# Patient Record
Sex: Male | Born: 1985
Health system: Southern US, Community
[De-identification: ages and names within clinical notes are randomized; demographics above are authoritative.]

## PROBLEM LIST (undated history)

## (undated) DIAGNOSIS — I10 Essential (primary) hypertension: Secondary | ICD-10-CM

## (undated) DIAGNOSIS — T8859XA Other complications of anesthesia, initial encounter: Secondary | ICD-10-CM

## (undated) HISTORY — PX: KNEE SURGERY: SHX244

## (undated) HISTORY — PX: APPENDECTOMY: SHX54

---
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
## 2012-09-13 ENCOUNTER — Emergency Department (HOSPITAL_COMMUNITY): Payer: Medicaid - Out of State

## 2012-09-13 ENCOUNTER — Encounter (HOSPITAL_COMMUNITY): Payer: Self-pay | Admitting: Emergency Medicine

## 2012-09-13 ENCOUNTER — Emergency Department (HOSPITAL_COMMUNITY)
Admission: EM | Admit: 2012-09-13 | Discharge: 2012-09-13 | Disposition: A | Discharge status: Home / Self Care | Payer: Medicaid - Out of State | Attending: Emergency Medicine | Admitting: Emergency Medicine

## 2012-09-13 DIAGNOSIS — I10 Essential (primary) hypertension: Secondary | ICD-10-CM | POA: Insufficient documentation

## 2012-09-13 DIAGNOSIS — R079 Chest pain, unspecified: Secondary | ICD-10-CM | POA: Insufficient documentation

## 2012-09-13 HISTORY — DX: Essential (primary) hypertension: I10

## 2012-09-13 LAB — BASIC METABOLIC PANEL
BUN: 11 mg/dL (ref 6–23)
CO2: 27 mEq/L (ref 19–32)
Chloride: 104 mEq/L (ref 96–112)
Glucose, Bld: 87 mg/dL (ref 70–99)
Potassium: 3.9 mEq/L (ref 3.5–5.1)

## 2012-09-13 LAB — CBC WITH DIFFERENTIAL/PLATELET
Hemoglobin: 15.9 g/dL (ref 13.0–17.0)
Lymphocytes Relative: 51 % — ABNORMAL HIGH (ref 12–46)
Lymphs Abs: 1.6 10*3/uL (ref 0.7–4.0)
MCH: 27 pg (ref 26.0–34.0)
Monocytes Relative: 9 % (ref 3–12)
Neutro Abs: 1.1 10*3/uL — ABNORMAL LOW (ref 1.7–7.7)
Neutrophils Relative %: 37 % — ABNORMAL LOW (ref 43–77)
RBC: 5.89 MIL/uL — ABNORMAL HIGH (ref 4.22–5.81)

## 2012-09-13 LAB — D-DIMER, QUANTITATIVE

## 2012-09-13 LAB — POCT I-STAT TROPONIN I: Troponin i, poc: 0 ng/mL (ref 0.00–0.08)

## 2012-09-13 LAB — PROTIME-INR

## 2012-09-13 MED ORDER — IOHEXOL 350 MG/ML SOLN
100.0000 mL | Freq: Once | INTRAVENOUS | Status: AC | PRN
  Administered 2012-09-13: 100 mL via INTRAVENOUS

## 2012-09-13 NOTE — ED Notes (Signed)
Pt reports chest pain on and off for past 3 months.  Reports chest pain started again last night.  Denies sob, dizziness or nausea.
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
## 2012-09-17 NOTE — ED Provider Notes (Signed)
Medical screening examination/treatment/procedure(s) were performed by non-physician practitioner and as supervising physician I was immediately available for consultation/collaboration.   Mayling Aber M Keric Zehren, DO 09/17/12 1402 

## 2013-06-26 ENCOUNTER — Encounter (HOSPITAL_COMMUNITY): Payer: Self-pay | Admitting: Emergency Medicine

## 2013-06-26 ENCOUNTER — Emergency Department (HOSPITAL_COMMUNITY)
Admission: EM | Admit: 2013-06-26 | Discharge: 2013-06-26 | Discharge status: Against Medical Advice | Payer: Medicaid - Out of State | Attending: Emergency Medicine | Admitting: Emergency Medicine

## 2013-06-26 DIAGNOSIS — I10 Essential (primary) hypertension: Secondary | ICD-10-CM | POA: Insufficient documentation

## 2013-06-26 DIAGNOSIS — F1010 Alcohol abuse, uncomplicated: Secondary | ICD-10-CM | POA: Insufficient documentation

## 2013-06-26 DIAGNOSIS — F10929 Alcohol use, unspecified with intoxication, unspecified: Secondary | ICD-10-CM

## 2013-06-26 DIAGNOSIS — IMO0002 Reserved for concepts with insufficient information to code with codable children: Secondary | ICD-10-CM | POA: Insufficient documentation
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
## 2013-06-26 NOTE — ED Notes (Signed)
Waiting to assume care of pt, responsible family members at bedside.  PA stated that pt could return home as long as he had a safe ride home.  Pt left with family before e-signature or vs could be obtained.  PA aware.
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
## 2013-06-26 NOTE — ED Notes (Signed)
Bed: [id number] Expected date: 06/26/13 Expected time: 6:02 PM Means of arrival: Ambulance Comments: EMS

## 2013-06-26 NOTE — ED Provider Notes (Signed)
Medical screening examination/treatment/procedure(s) were performed by non-physician practitioner and as supervising physician I was immediately available for consultation/collaboration.   EKG Interpretation None        Chicquita Mendel, MD 06/26/13 1959 

## 2014-12-14 ENCOUNTER — Other Ambulatory Visit: Payer: Self-pay | Admitting: Occupational Medicine

## 2014-12-14 ENCOUNTER — Ambulatory Visit: Payer: Self-pay

## 2014-12-14 DIAGNOSIS — M79644 Pain in right finger(s): Secondary | ICD-10-CM

## 2014-12-27 ENCOUNTER — Emergency Department (HOSPITAL_COMMUNITY)
Admission: EM | Admit: 2014-12-27 | Discharge: 2014-12-27 | Disposition: A | Discharge status: Home / Self Care | Payer: BLUE CROSS/BLUE SHIELD | Attending: Emergency Medicine | Admitting: Emergency Medicine

## 2014-12-27 ENCOUNTER — Emergency Department (HOSPITAL_COMMUNITY): Payer: BLUE CROSS/BLUE SHIELD

## 2014-12-27 ENCOUNTER — Encounter (HOSPITAL_COMMUNITY): Payer: Self-pay | Admitting: Neurology

## 2014-12-27 DIAGNOSIS — Z79899 Other long term (current) drug therapy: Secondary | ICD-10-CM | POA: Insufficient documentation

## 2014-12-27 DIAGNOSIS — I10 Essential (primary) hypertension: Secondary | ICD-10-CM | POA: Diagnosis not present

## 2014-12-27 DIAGNOSIS — R079 Chest pain, unspecified: Secondary | ICD-10-CM | POA: Diagnosis present

## 2014-12-27 LAB — BASIC METABOLIC PANEL
Anion gap: 10 (ref 5–15)
BUN: 13 mg/dL (ref 6–20)
CALCIUM: 9.9 mg/dL (ref 8.9–10.3)
CO2: 26 mmol/L (ref 22–32)
CREATININE: 1.22 mg/dL (ref 0.61–1.24)
Chloride: 105 mmol/L (ref 101–111)
GLUCOSE: 101 mg/dL — AB (ref 65–99)
Potassium: 4 mmol/L (ref 3.5–5.1)
SODIUM: 141 mmol/L (ref 135–145)

## 2014-12-27 LAB — CBC
HCT: 47.3 % (ref 39.0–52.0)
Hemoglobin: 15.6 g/dL (ref 13.0–17.0)
MCH: 28 pg (ref 26.0–34.0)
MCHC: 33 g/dL (ref 30.0–36.0)
MCV: 84.9 fL (ref 78.0–100.0)
PLATELETS: 216 10*3/uL (ref 150–400)
RBC: 5.57 MIL/uL (ref 4.22–5.81)
RDW: 12.7 % (ref 11.5–15.5)
WBC: 3.9 10*3/uL — ABNORMAL LOW (ref 4.0–10.5)

## 2014-12-27 LAB — I-STAT TROPONIN, ED: Troponin i, poc: 0.01 ng/mL (ref 0.00–0.08)

## 2014-12-27 NOTE — Discharge Instructions (Signed)
Hypertension Hypertension, commonly called high blood pressure, is when the force of blood pumping through your arteries is too strong. Your arteries are the blood vessels that carry blood from your heart throughout your body. A blood pressure reading consists of a higher number over a lower number, such as 110/72. The higher number (systolic) is the pressure inside your arteries when your heart pumps. The lower number (diastolic) is the pressure inside your arteries when your heart relaxes. Ideally you want your blood pressure below 120/80. Hypertension forces your heart to work harder to pump blood. Your arteries may become narrow or stiff. Having untreated or uncontrolled hypertension can cause heart attack, stroke, kidney disease, and other problems. RISK FACTORS Some risk factors for high blood pressure are controllable. Others are not.  Risk factors you cannot control include:   Race. You may be at higher risk if you are African American.  Age. Risk increases with age.  Gender. Men are at higher risk than women before age 45 years. After age 65, women are at higher risk than men. Risk factors you can control include:  Not getting enough exercise or physical activity.  Being overweight.  Getting too much fat, sugar, calories, or salt in your diet.  Drinking too much alcohol. SIGNS AND SYMPTOMS Hypertension does not usually cause signs or symptoms. Extremely high blood pressure (hypertensive crisis) may cause headache, anxiety, shortness of breath, and nosebleed. DIAGNOSIS To check if you have hypertension, your health care provider will measure your blood pressure while you are seated, with your arm held at the level of your heart. It should be measured at least twice using the same arm. Certain conditions can cause a difference in blood pressure between your right and left arms. A blood pressure reading that is higher than normal on one occasion does not mean that you need treatment. If  it is not clear whether you have high blood pressure, you may be asked to return on a different day to have your blood pressure checked again. Or, you may be asked to monitor your blood pressure at home for 1 or more weeks. TREATMENT Treating high blood pressure includes making lifestyle changes and possibly taking medicine. Living a healthy lifestyle can help lower high blood pressure. You may need to change some of your habits. Lifestyle changes may include:  Following the DASH diet. This diet is high in fruits, vegetables, and whole grains. It is low in salt, red meat, and added sugars.  Keep your sodium intake below 2,300 mg per day.  Getting at least 30-45 minutes of aerobic exercise at least 4 times per week.  Losing weight if necessary.  Not smoking.  Limiting alcoholic beverages.  Learning ways to reduce stress. Your health care provider may prescribe medicine if lifestyle changes are not enough to get your blood pressure under control, and if one of the following is true:  You are 18-59 years of age and your systolic blood pressure is above 140.  You are 60 years of age or older, and your systolic blood pressure is above 150.  Your diastolic blood pressure is above 90.  You have diabetes, and your systolic blood pressure is over 140 or your diastolic blood pressure is over 90.  You have kidney disease and your blood pressure is above 140/90.  You have heart disease and your blood pressure is above 140/90. Your personal target blood pressure may vary depending on your medical conditions, your age, and other factors. HOME CARE INSTRUCTIONS    Have your blood pressure rechecked as directed by your health care provider.   Take medicines only as directed by your health care provider. Follow the directions carefully. Blood pressure medicines must be taken as prescribed. The medicine does not work as well when you skip doses. Skipping doses also puts you at risk for  problems.  Do not smoke.   Monitor your blood pressure at home as directed by your health care provider. SEEK MEDICAL CARE IF:   You think you are having a reaction to medicines taken.  You have recurrent headaches or feel dizzy.  You have swelling in your ankles.  You have trouble with your vision. SEEK IMMEDIATE MEDICAL CARE IF:  You develop a severe headache or confusion.  You have unusual weakness, numbness, or feel faint.  You have severe chest or abdominal pain.  You vomit repeatedly.  IYou have trouble breathing. MAKE SURE YOU:   Understand these instructions.  Will watch your condition.  Will get help right away if you are not doing well or get worse.   This information is not intended to replace advice given to you by your health care provider. Make sure you discuss any questions you have with your health care provider.   Document Released: 02/24/2005 Document Revised: 07/11/2014 Document Reviewed: 12/17/2012 Elsevier Interactive Patient Education 2016 [employer].  Emergency Department Resource Guide 1) Find a Doctor and Pay Out of Pocket Although you won't have to find out who is covered by your insurance plan, it is a good idea to ask around and get recommendations. You will then need to call the office and see if the doctor you have chosen will accept you as a new patient and what types of options they offer for patients who are self-pay. Some doctors offer discounts or will set up payment plans for their patients who do not have insurance, but you will need to ask so you aren't surprised when you get to your appointment.  2) Contact Your Local Health Department Not all health departments have doctors that can see patients for sick visits, but many do, so it is worth a call to see if yours does. If you don't know where your local health department is, you can check in your phone book. The CDC also has a tool to help you locate your state's health  department, and many state websites also have listings of all of their local health departments.  3) Find a Walk-in Clinic If your illness is not likely to be very severe or complicated, you may want to try a walk in clinic. These are popping up all over the country in pharmacies, drugstores, and shopping centers. They're usually staffed by nurse practitioners or physician assistants that have been trained to treat common illnesses and complaints. They're usually fairly quick and inexpensive. However, if you have serious medical issues or chronic medical problems, these are probably not your best option.  No Primary Care Doctor: - Call Health Connect at  [phone] - they can help you locate a primary care doctor that  accepts your insurance, provides certain services, etc. - Physician Referral Service- [phone]  Chronic Pain Problems: Organization         Address  Phone   Notes  [facility]  [phone] Patients need to be referred by their primary care doctor.   Medication Assistance: Organization         Address  Phone   Notes  [facility] Medication Assistance  Program [address] [place], [place] [address] [phone]) [phone] --Must be a resident of [place] [facility] -- Must have NO insurance coverage whatsoever (no Medicaid/ Medicare, etc.) -- The pt. MUST have a primary care doctor that directs their care regularly and follows them in the community   MedAssist  [phone]) [phone]   [employer]  [phone]) [phone]    Agencies that provide inexpensive medical care: Organization         Address  Phone   Notes  [facility]  [phone]) [phone]   [facility]    [phone]   [facility] [address] [place], [place] [address] [phone]) [phone]   [facility] [address], [place] [phone]   [facility]anned [facility]    [phone]   [facility]    [phone]) [phone]    [facility]  [address], [place] Phone:  [phone]) [phone], Fax:  [phone] Hours of Operation:  9 am - 6 pm, M-F.  Also accepts Medicaid/Medicare and self-pay.  [facility]  [address], [place] Phone: [phone]) [phone], Fax: [phone]. Hours of Operation:  8:30 am - 5:30 pm, M-F.  Also accepts Medicaid and self-pay.  [facility] [place] [address], [place] Phone: [phone]) [phone]   [facility] [address], [place], [place] [phone], Ext. [phone] Mondays & Thursdays: 7-9 AM.  First 15 patients are seen on a first come, first serve basis.    Medicaid-accepting [facility] Providers:  Organization         Address  Phone   Notes  [facility] [address], [place] [phone] Also accepts self-pay patients.  [facility] [address], [place]  [phone]   [facility] [address], [place] [phone]) [phone]   [facility] [address] [place] [address], [place] [phone]) [phone]   Renaye RakersVeita Bland [address], [place]   [phone]) [phone] Only accepts [place] Access [place] patients after they have their name applied to their card.   Self-Pay (no insurance) in [facility]:  Organization         Address  Phone   Notes  Sickle Cell Patients, [facility] [address], [place] [phone]   [facility] [address], [place] [phone]) [phone]   [facility] [place]  [address], [place] [phone]) [phone]   [facility]/Dr. Osei-Bonsu  [address] [place] [address], [place] or [phone], [place] [phone] Phone number for both [place] and [place] locations is the same.  [facility] [address], [place] [phone]) [phone]    [facility] [address] [place] [address], [place] or [address] [phone]   [facility] [address], [place] [phone]) [phone], phone; [phone]) [phone], fax Sees patients 1st and 3rd Saturday of every month.  Must not qualify for public or private insurance (i.e. Medicaid, Medicare, [place] Health Choice, Veterans' Benefits)  Household income should be no more than 200% of the poverty level The clinic cannot treat you if you are pregnant or think you are pregnant  Sexually transmitted diseases are not treated at the  clinic.    Dental Care: Organization         Address  Phone  Notes  [facility] [address] [place], [place] [phone] Accepts children up to age 34 who are enrolled in [place] or [place] Health Choice; pregnant women with a Medicaid card; and children who have applied for Medicaid or [place] Health Choice, but were declined, whose parents can pay a reduced fee at time of service.  [facility]  [address], [place] [phone] Accepts children up to age 94 who are enrolled in [place] or [place] Health Choice; pregnant women with a Medicaid card; and children who have applied for Medicaid or [place] Health Choice, but were declined, whose parents can pay a reduced fee at time of service.  [facility]  [address] [place], [place] [phone] Patients are seen by appointment only. Walk-ins are not accepted. [facility] will see patients 66 years of age and older. Monday - Tuesday (8am-5pm) Most Wednesdays (8:30-5pm) $30 per visit, cash only  [facility] Adult Dental Access PROGRAM  [address], [place] [facility] [phone] Patients are seen by appointment only. Walk-ins are not accepted. [facility] will see patients 41 years of age and older. One Wednesday Evening (Monthly: Volunteer Based).   $30 per visit, cash only  [employer]  [phone] for adults; Children under age 79, call Graduate Pediatric Dentistry at [phone]. Children aged 23-14, please call [phone] to request a pediatric application.  Dental services are provided in all areas of dental care including fillings, crowns and bridges, complete and partial dentures, implants, gum treatment, root canals, and extractions. Preventive care is also provided. Treatment is provided to both adults and children. Patients are selected via a lottery and there is often a waiting list.   [facility] [address] [place]  [phone] [email or web address]   [facility] [address] [place], [place] [phone], Ext. [phone] Second and Fourth Thursday of each month, opens at 6:30 AM; Clinic ends at 9 AM.  Patients are seen on a first-come first-served basis, and a limited number are seen during each clinic.   [facility]  [address] [place] [place], [place] [phone]   Eligibility Requirements You must have lived in [place], [place], or [place] counties for at least the last three months.   You cannot be eligible for state or federal sponsored [place], including [employer], [place], or [employer].   You generally cannot be eligible for healthcare insurance through your employer.    How to apply: Eligibility screenings are held every Tuesday and Wednesday afternoon from 1:00 pm until 4:00 pm. You do not need an appointment for the interview!  [facility] [address] [place], [place] [phone]   [facility] Health Department  [phone]   [facility] Health Department  [phone]   [facility] Health Department  [phone]    Behavioral Health Resources in the Community: Intensive Outpatient Programs Organization         Address  Phone  Notes  [facility] [address] [place], [place] [phone]   [facility] [address] [place], [place] [phone]   [facility] [address] [place], [place]  [phone]   [facility]  [address]  [place], [place] [phone] or [phone]   Substance Abuse Resources Organization         Address  Phone  Notes  [facility]  [phone]   [facility]  [phone]   [facility] [place]  [phone]   Floydene Flock  [phone]   Residential & Outpatient Substance Abuse Program  [phone]   Psychological Services Organization         Address  Phone  Notes  [facility]  [phone]   [facility] Services  [phone]   [facility] [address], [place] [phone] or 8451439728    Mobile Crisis Teams Organization         Address  Phone  Notes  [facility], Mobile Crisis Care Unit  [phone]   [facility]  [address] [place], [place] [phone]   Doristine Locks [address] [place] [phone]    Self-Help/Support Groups Organization         Address  Phone             Notes  [facility] [place] - variety of support groups  [phone]- I7437963 Call for more information  Narcotics Anonymous (NA), [facility] [address], [employer] [place]  2 meetings at this location   Statistician         Address  Phone  Notes  [facility] [address],    [place]  [phone]   [facility]  [address], [place] [address], [place], [place] [phone]   [facility] [address] [place], [place] [phone] Admissions: 8am-3pm M-F  [facility] [address]    [place], [place] [phone]   [facility] [address] [place], [place], [place] [phone]   [facility] [address],  [place], [place] [phone]   [facility] - Intensive Outpatient [address], [place], [place] [phone]   [facility] ([facility] [address] [place]  [place], [place] [phone] or [phone]   [facility] ([facility]) [address], [place], [place] [phone] Accepts Medicaid  [facility] [address],  [place] [phone] Substance Abuse/Addiction Treatment   [facility] Organization         Address  Phone  Notes  [facility]  [phone]   Angie Fava, PhD [address] Kn[address] [place], [place]   [phone] or 9307781298   [facility] Behavioral   [address] [place], [place] [phone]   [facility] [address], [place], [place] [phone] Insurance/Medicaid/sponsorship through [facility] and Families [address]                                    [place], [place] [phone] Therapy/tele-psych/case  [facility] [address], [place] [phone]    Dr. Lolly Mustache  [phone]   [facility]  [employer] [facility] Dept. 1) [address] [place] 2) [address], [place] 3)  [address], [place] [phone]  [phone]   [facility] Child Abuse Hotline 310-190-0584 or [phone] (After Hours)  It is very important that you take your medication every day at about the same time.  I would also like you to have your blood pressure checked 2-3 times a  Week at different time during the day.  Make an appointment with a PCP to keep establish care.   .res

## 2014-12-27 NOTE — ED Notes (Signed)
Pt reports sharp intermittent cp to left side of chest for several days. Denies n/v/d. Skin warm and dry.
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
## 2018-07-27 DIAGNOSIS — R0982 Postnasal drip: Secondary | ICD-10-CM | POA: Diagnosis not present

## 2018-07-27 DIAGNOSIS — R05 Cough: Secondary | ICD-10-CM | POA: Diagnosis not present

## 2018-07-27 DIAGNOSIS — R509 Fever, unspecified: Secondary | ICD-10-CM | POA: Diagnosis not present

## 2018-07-27 DIAGNOSIS — Z20828 Contact with and (suspected) exposure to other viral communicable diseases: Secondary | ICD-10-CM | POA: Diagnosis not present

## 2018-07-27 DIAGNOSIS — I10 Essential (primary) hypertension: Secondary | ICD-10-CM | POA: Diagnosis not present

## 2018-07-27 DIAGNOSIS — J029 Acute pharyngitis, unspecified: Secondary | ICD-10-CM | POA: Diagnosis not present

## 2018-07-27 DIAGNOSIS — R52 Pain, unspecified: Secondary | ICD-10-CM | POA: Diagnosis not present

## 2018-07-27 DIAGNOSIS — R0981 Nasal congestion: Secondary | ICD-10-CM | POA: Diagnosis not present

## 2018-09-27 ENCOUNTER — Other Ambulatory Visit: Payer: Self-pay

## 2018-09-27 ENCOUNTER — Emergency Department (HOSPITAL_BASED_OUTPATIENT_CLINIC_OR_DEPARTMENT_OTHER): Payer: BC Managed Care – PPO

## 2018-09-27 ENCOUNTER — Encounter (HOSPITAL_BASED_OUTPATIENT_CLINIC_OR_DEPARTMENT_OTHER): Payer: Self-pay | Admitting: *Deleted

## 2018-09-27 ENCOUNTER — Emergency Department (HOSPITAL_BASED_OUTPATIENT_CLINIC_OR_DEPARTMENT_OTHER)
Admission: EM | Admit: 2018-09-27 | Discharge: 2018-09-27 | Disposition: A | Discharge status: Home / Self Care | Payer: BC Managed Care – PPO | Attending: Emergency Medicine | Admitting: Emergency Medicine

## 2018-09-27 DIAGNOSIS — F1490 Cocaine use, unspecified, uncomplicated: Secondary | ICD-10-CM | POA: Diagnosis not present

## 2018-09-27 DIAGNOSIS — R0789 Other chest pain: Secondary | ICD-10-CM | POA: Diagnosis not present

## 2018-09-27 DIAGNOSIS — F1410 Cocaine abuse, uncomplicated: Secondary | ICD-10-CM | POA: Diagnosis not present

## 2018-09-27 DIAGNOSIS — Z79899 Other long term (current) drug therapy: Secondary | ICD-10-CM | POA: Diagnosis not present

## 2018-09-27 DIAGNOSIS — R079 Chest pain, unspecified: Secondary | ICD-10-CM | POA: Diagnosis not present

## 2018-09-27 DIAGNOSIS — I10 Essential (primary) hypertension: Secondary | ICD-10-CM | POA: Diagnosis not present

## 2018-09-27 LAB — BASIC METABOLIC PANEL
Anion gap: 12 (ref 5–15)
BUN: 13 mg/dL (ref 6–20)
CO2: 27 mmol/L (ref 22–32)
Calcium: 9.8 mg/dL (ref 8.9–10.3)
Chloride: 99 mmol/L (ref 98–111)
Creatinine, Ser: 1.17 mg/dL (ref 0.61–1.24)
GFR calc Af Amer: >60 mL/min (ref >60)
GFR calc non Af Amer: >60 mL/min (ref >60)
Glucose, Bld: 92 mg/dL (ref 70–99)
Potassium: 3.6 mmol/L (ref 3.5–5.1)
Sodium: 138 mmol/L (ref 135–145)

## 2018-09-27 LAB — CBC WITH DIFFERENTIAL/PLATELET
Abs Immature Granulocytes: 0.03 10*3/uL (ref 0.00–0.07)
Basophils Absolute: 0 10*3/uL (ref 0.0–0.1)
Basophils Relative: 1 %
Eosinophils Absolute: 0.1 10*3/uL (ref 0.0–0.5)
Eosinophils Relative: 3 %
HCT: 51.2 % (ref 39.0–52.0)
Hemoglobin: 15.9 g/dL (ref 13.0–17.0)
Immature Granulocytes: 1 %
Lymphocytes Relative: 34 %
Lymphs Abs: 1.4 10*3/uL (ref 0.7–4.0)
MCH: 26.9 pg (ref 26.0–34.0)
MCHC: 31.1 g/dL (ref 30.0–36.0)
MCV: 86.8 fL (ref 80.0–100.0)
Monocytes Absolute: 0.5 10*3/uL (ref 0.1–1.0)
Monocytes Relative: 11 %
Neutro Abs: 2.1 10*3/uL (ref 1.7–7.7)
Neutrophils Relative %: 50 %
Platelets: 254 10*3/uL (ref 150–400)
RBC: 5.9 MIL/uL — ABNORMAL HIGH (ref 4.22–5.81)
RDW: 13.6 % (ref 11.5–15.5)
WBC: 4.2 10*3/uL (ref 4.0–10.5)
nRBC: 0 % (ref 0.0–0.2)

## 2018-09-27 LAB — TROPONIN I (HIGH SENSITIVITY): Troponin I (High Sensitivity): 4 ng/L (ref <18)

## 2018-09-27 NOTE — ED Notes (Signed)
ED Provider at bedside. 

## 2018-09-27 NOTE — Discharge Instructions (Signed)
Given that you have had pain for more than two hours your troponin (heart enzyme) falls in the normal range.   Please make sure that you are taking your blood pressure medicine.  As we discussed I would recommend that you stop using cocaine as this can worsen high blood pressure and any heart disease.  I have given you the information for cardiology.  If you stop all cocaine use for multiple weeks and still have concerns you may follow-up with them.  I have also given you the information for [facility].  If your symptoms worsen, change, or become concerning to you please seek additional medical care and evaluation.

## 2018-09-27 NOTE — ED Triage Notes (Signed)
Pt c/o chest pain x 2 days, Seen at [facility] today DX hypertension and sent here for eval

## 2018-09-27 NOTE — ED Provider Notes (Signed)
[facility] EMERGENCY DEPARTMENT Provider Note   CSN: [id number] Arrival date & time: 09/27/18  1748    History   Chief Complaint Chief Complaint  Patient presents with  . Chest Pain    HPI Jason [name] is a 33 y.o. male with a past medical history of hypertension, who presents today for evaluation of chest pain.  Reports that over the past many years he intermittently gets pain in the left side of his chest.  He states that approximately 1 week ago he ran out of his lisinopril-HCTZ.  He initially went to urgent care who gave him a 30-day refill and sent him here for evaluation.  He reports that this episode of chest pain started this morning when he woke up.  He denies shortness of breath.  The pain is not radiated or moved.  He denies diaphoresis, nausea, fever, or cough.  He states that usually when he gets this pain it lasts throughout the morning and is gone by noon however it has lasted the entire day before.  He reports that he does smoke occasional cigarettes when he drinks on the weekends.  He also endorses occasional cocaine use, stating last time he used was Thursday night.  He denies any known sick contacts.  His pain is made worse with moving his left arm as he feels that pulling across his chest worsening in his pain.  No interventions tried prior to arrival.  He denies any trauma.  No leg swelling.  No recent surgery/immobilizations.  No hemoptysis, history of PE/DVT.    HPI  Past Medical History:  Diagnosis Date  . Hypertension     There are no active problems to display for this patient.   Past Surgical History:  Procedure Laterality Date  . KNEE SURGERY          Home Medications    Prior to Admission medications   Medication Sig Start Date End Date Taking? Authorizing Provider  lisinopril-hydrochlorothiazide (ZESTORETIC) 20-25 MG tablet Take 1 tablet by mouth daily.   Yes [provider]    Family History History reviewed. No  pertinent family history.  Social History Social History   Tobacco Use  . Smoking status: Never Smoker  . Smokeless tobacco: Never Used  Substance Use Topics  . Alcohol use: Yes  . Drug use: Not on file     Allergies   Patient has no known allergies.   Review of Systems Review of Systems  Constitutional: Negative for chills, diaphoresis, fatigue and fever.  Eyes: Negative for visual disturbance.  Respiratory: Negative for cough, chest tightness and shortness of breath.   Cardiovascular: Positive for chest pain. Negative for palpitations and leg swelling.  Gastrointestinal: Negative for nausea.  Neurological: Negative for weakness and headaches.  All other systems reviewed and are negative.    Physical Exam Updated Vital Signs BP (!) 147/98 (BP Location: Right Arm)   Pulse 61   Temp 98.5 F (36.9 C) (Oral)   Resp 18   Ht 6\' 1"  (1.854 m)   Wt 104.3 kg   SpO2 100%   BMI 30.34 kg/m   Physical Exam Vitals signs and nursing note reviewed.  Constitutional:      Appearance: He is well-developed. He is not toxic-appearing.  HENT:     Head: Normocephalic and atraumatic.  Eyes:     Conjunctiva/sclera: Conjunctivae normal.  Neck:     Musculoskeletal: Normal range of motion and neck supple.     Vascular: No JVD.  Trachea: No tracheal deviation.  Cardiovascular:     Rate and Rhythm: Normal rate and regular rhythm.     Pulses:          Radial pulses are 2+ on the right side and 2+ on the left side.       Dorsalis pedis pulses are 2+ on the right side and 2+ on the left side.       Posterior tibial pulses are 2+ on the right side and 2+ on the left side.     Heart sounds: Normal heart sounds. No murmur.  Pulmonary:     Effort: Pulmonary effort is normal. No respiratory distress.     Breath sounds: Normal breath sounds.  Chest:     Chest wall: No tenderness.     Comments: When abducting left arm able to recreate and exacerbate his reported pain in his chest.   Unable to recreate pain with direct pressure/palpation over chest.  Abdominal:     Palpations: Abdomen is soft.     Tenderness: There is no abdominal tenderness.  Musculoskeletal:     Right lower leg: He exhibits no tenderness. No edema.     Left lower leg: He exhibits no tenderness. No edema.  Skin:    General: Skin is warm and dry.  Neurological:     Mental Status: He is alert.      ED Treatments / Results  Labs (all labs ordered are listed, but only abnormal results are displayed) Labs Reviewed  CBC WITH DIFFERENTIAL/PLATELET - Abnormal; Notable for the following components:      Result Value   RBC 5.90 (*)    All other components within normal limits  BASIC METABOLIC PANEL  TROPONIN I (HIGH SENSITIVITY)  TROPONIN I (HIGH SENSITIVITY)    EKG EKG Interpretation  Date/Time:  Monday September 27 2018 17:55:23 EDT Ventricular Rate:  72 PR Interval:  164 QRS Duration: 84 QT Interval:  380 QTC Calculation: 416 R Axis:   93 Text Interpretation:  Normal sinus rhythm Rightward axis Borderline ECG since last tracing no significant change Confirmed by Malvin Johns [phone]) on 09/27/2018 9:52:00 PM   Radiology Dg Chest 2 View  Result Date: 09/27/2018 CLINICAL DATA:  Left-sided chest pain for 3-4 days. EXAM: CHEST - 2 VIEW COMPARISON:  12/27/2014 FINDINGS: The heart size and mediastinal contours are within normal limits. Both lungs are clear. The visualized skeletal structures are unremarkable. IMPRESSION: Normal exam. Electronically Signed   By: Lorriane Shire M.D.   On: 09/27/2018 19:07    Procedures Procedures (including critical care time)  Medications Ordered in ED Medications - No data to display   Initial Impression / Assessment and Plan / ED Course  I have reviewed the triage vital signs and the nursing notes.  Pertinent labs & imaging results that were available during my care of the patient were reviewed by me and considered in my medical decision making (see chart  for details).       Patient is to be discharged with recommendation to follow up with PCP in regards to today's hospital visit. Chest pain is not likely of cardiac or pulmonary etiology d/t presentation, PERC negative, VSS, no tracheal deviation, no JVD or new murmur, RRR, breath sounds equal bilaterally, EKG without acute abnormalities, negative troponin given symptoms present for more than 6 hours, and negative CXR. Pt has been advised to return to the ED if CP becomes exertional, associated with diaphoresis or nausea, radiates to left jaw/arm, worsens or becomes  concerning in any way. Pt appears reliable for follow up and is agreeable to discharge.   Patient was advised to stop using cocaine.  He had his antihypertensives refilled this morning by the urgent care center which will last until his scheduled follow-up appointment in August.  Return precautions were discussed with patient who states their understanding.  At the time of discharge patient denied any unaddressed complaints or concerns.  Patient is agreeable for discharge home.   Final Clinical Impressions(s) / ED Diagnoses   Final diagnoses:  Atypical chest pain  Cocaine use  Hypertension, unspecified type    ED Discharge Orders    None       Norman ClayHammond, Aeva Posey W, PA-C 09/27/18 2206    Rolan BuccoBelfi, Melanie, MD 09/27/18 2245

## 2018-09-27 NOTE — ED Notes (Signed)
Patient verbalizes understanding of discharge instructions. Opportunity for questioning and answers were provided. Armband removed by staff, pt discharged from ED.  

## 2018-10-07 ENCOUNTER — Encounter: Payer: Self-pay | Admitting: Cardiology

## 2018-10-07 DIAGNOSIS — I10 Essential (primary) hypertension: Secondary | ICD-10-CM | POA: Insufficient documentation

## 2018-10-07 HISTORY — DX: Essential (primary) hypertension: I10

## 2018-10-11 ENCOUNTER — Ambulatory Visit (INDEPENDENT_AMBULATORY_CARE_PROVIDER_SITE_OTHER): Payer: BC Managed Care – PPO | Admitting: Cardiology

## 2018-10-11 ENCOUNTER — Other Ambulatory Visit: Payer: Self-pay | Admitting: Cardiology

## 2018-10-11 ENCOUNTER — Encounter: Payer: Self-pay | Admitting: Cardiology

## 2018-10-11 ENCOUNTER — Other Ambulatory Visit: Payer: Self-pay

## 2018-10-11 VITALS — BP 151/110 | HR 79 | Ht 73.0 in | Wt 234.0 lb

## 2018-10-11 DIAGNOSIS — F1490 Cocaine use, unspecified, uncomplicated: Secondary | ICD-10-CM | POA: Diagnosis not present

## 2018-10-11 DIAGNOSIS — Z720 Tobacco use: Secondary | ICD-10-CM

## 2018-10-11 DIAGNOSIS — I10 Essential (primary) hypertension: Secondary | ICD-10-CM

## 2018-10-11 DIAGNOSIS — R0789 Other chest pain: Secondary | ICD-10-CM

## 2018-10-11 MED ORDER — AMLODIPINE BESYLATE 10 MG PO TABS: 10.0000 mg | ORAL_TABLET | Freq: Every day | ORAL | 1 refills | Status: DC

## 2018-10-11 NOTE — Progress Notes (Signed)
Primary Physician:  Stevphen RochesterStabb, Monica, NP   Patient ID: Jason PointsJeffrey Shevlin, male    DOB: 04/14/1985, 33 y.o.   MRN: [id number]  Subjective:    Chief Complaint  Patient presents with  . Chest Pain    comes and goes   . New Patient (Initial Visit)    HPI: Jason [name]  is a 33 y.o. male  with hypertension, occasional cigar and cocaine use, referred to us by Stevphen RochesterMonica Stabb, NPfor evaluation of chest pain.  Patient was seen in the emergency room on 10/01/18 for intermittent chest pain.  Patient reportedly had been out of his lisinopril hydrochlorothiazide for 1 week.  He reports intermittent episodes of chest pain generally occurring several times throughout the day. Can last for several minutes at a time. No pain radiation or shortness of breath. EKG was without acute abnormalities, troponin negative, and chest x-ray was negative. He currently does not have a PCP, he was advised to establish with PCP and see Cardiology.  He states that he has had hypertension for many years and has been fairly well controlled; however, has not been monitored over the last few years.  He does drink alcohol, probably heavy over the weekend. He does occasionally smoke cigars when he drinks.   He has not exercised for the last 6 months due to the pandemic.  Past Medical History:  Diagnosis Date  . HTN (hypertension) 10/07/2018  . Hypertension     Past Surgical History:  Procedure Laterality Date  . KNEE SURGERY      Social History   Socioeconomic History  . Marital status: Single    Spouse name: Not on file  . Number of children: 2  . Years of education: Not on file  . Highest education level: Not on file  Occupational History  . Not on file  Social Needs  . Financial resource strain: Not on file  . Food insecurity    Worry: Not on file    Inability: Not on file  . Transportation needs    Medical: Not on file    Non-medical: Not on file  Tobacco Use  . Smoking status: Current Every Day  Smoker    Types: Cigars  . Smokeless tobacco: Never Used  Substance and Sexual Activity  . Alcohol use: Yes  . Drug use: Not on file  . Sexual activity: Not on file  Lifestyle  . Physical activity    Days per week: Not on file    Minutes per session: Not on file  . Stress: Not on file  Relationships  . Social Musicianconnections    Talks on phone: Not on file    Gets together: Not on file    Attends religious service: Not on file    Active member of club or organization: Not on file    Attends meetings of clubs or organizations: Not on file    Relationship status: Not on file  . Intimate partner violence    Fear of current or ex partner: Not on file    Emotionally abused: Not on file    Physically abused: Not on file    Forced sexual activity: Not on file  Other Topics Concern  . Not on file  Social History Narrative  . Not on file    Review of Systems  Constitution: Negative for decreased appetite, malaise/fatigue, weight gain and weight loss.  Eyes: Negative for visual disturbance.  Cardiovascular: Positive for chest pain. Negative for claudication, dyspnea on exertion, leg swelling,  orthopnea, palpitations and syncope.  Respiratory: Negative for hemoptysis and wheezing.   Endocrine: Negative for cold intolerance and heat intolerance.  Hematologic/Lymphatic: Does not bruise/bleed easily.  Skin: Negative for nail changes.  Musculoskeletal: Negative for muscle weakness and myalgias.  Gastrointestinal: Negative for abdominal pain, change in bowel habit, nausea and vomiting.  Neurological: Negative for difficulty with concentration, dizziness, focal weakness and headaches.  Psychiatric/Behavioral: Negative for altered mental status and suicidal ideas.  All other systems reviewed and are negative.     Objective:  Blood pressure (!) 151/110, pulse 79, height 6\' 1"  (1.854 m), weight 234 lb (106.1 kg), SpO2 97 %. Body mass index is 30.87 kg/m.    Physical Exam  Constitutional: He  is oriented to person, place, and time. Vital signs are normal. He appears well-developed and well-nourished.  HENT:  Head: Normocephalic and atraumatic.  Neck: Normal range of motion.  Cardiovascular: Normal rate, regular rhythm, normal heart sounds and intact distal pulses.  Pulmonary/Chest: Effort normal and breath sounds normal. No accessory muscle usage. No respiratory distress.  Abdominal: Soft. Bowel sounds are normal.  Musculoskeletal: Normal range of motion.  Neurological: He is alert and oriented to person, place, and time.  Skin: Skin is warm and dry.  Vitals reviewed.  Radiology: No results found.  Laboratory examination:    CMP Latest Ref Rng & Units 09/27/2018 12/27/2014 09/13/2012  Glucose 70 - 99 mg/dL 92 101(H) 87  BUN 6 - 20 mg/dL 13 13 11   Creatinine 0.61 - 1.24 mg/dL 1.17 1.22 1.10  Sodium 135 - 145 mmol/L 138 141 139  Potassium 3.5 - 5.1 mmol/L 3.6 4.0 3.9  Chloride 98 - 111 mmol/L 99 105 104  CO2 22 - 32 mmol/L 27 26 27   Calcium 8.9 - 10.3 mg/dL 9.8 9.9 9.6   CBC Latest Ref Rng & Units 09/27/2018 12/27/2014 09/13/2012  WBC 4.0 - 10.5 K/uL 4.2 3.9(L) 3.1(L)  Hemoglobin 13.0 - 17.0 g/dL 15.9 15.6 15.9  Hematocrit 39.0 - 52.0 % 51.2 47.3 48.1  Platelets 150 - 400 K/uL 254 216 206   Lipid Panel  No results found for: CHOL, TRIG, HDL, CHOLHDL, VLDL, LDLCALC, LDLDIRECT HEMOGLOBIN A1C No results found for: HGBA1C, MPG TSH No results for input(s): TSH in the last 8760 hours.  PRN Meds:. There are no discontinued medications. Current Meds  Medication Sig  . Cyanocobalamin (VITAMIN B-12 PO) Take by mouth daily.  Marland Kitchen lisinopril-hydrochlorothiazide (ZESTORETIC) 20-25 MG tablet Take 1 tablet by mouth daily.    Cardiac Studies:     Assessment:     ICD-10-CM   1. Atypical chest pain  R07.89 EKG 12-Lead  2. Essential hypertension  I10   3. Cocaine use  F14.90   4. Tobacco use  Z72.0     EKG 10/11/2018: Normal sinus rhythm at 68 bpm, rightward axis, IRBBB,  no evidence of ischemia.  Recommendations:   Patient has been having intermittent chest pain for the last several years; however, now occurring almost daily. His symptoms are atypical, not associated with exertion. I suspect may be related to hypertension as his blood pressure is markedly elevated today. Will continue with lisinopril HCT and add amlodipine 10 mg daily. Will obtain echocardiogram to exclude any structural abnormalities. Will also perform GXT for further evaluation of chest pain. He has not had recent lipid panel, will perform this for further risk stratification. I have encouraged him to be completely abstinent from cocaine use. Discussed long term risk and cardiovascular risk associated with use. I  have also encouraged him to stop cigar use. Discussed long term risk of tobacco use. He is willing to work on this. I will see him back after the test in 4 weeks for follow up.    *I have discussed this case with Dr. Jacinto HalimGanji and he personally examined the patient and participated in formulating the plan.*   Toniann FailAshton Haynes Mia Winthrop, MSN, APRN, FNP-C [facility] [place] Office: [phone] Fax: [phone]

## 2018-10-12 ENCOUNTER — Encounter: Payer: Self-pay | Admitting: Cardiology

## 2018-10-12 LAB — LIPID PANEL
Chol/HDL Ratio: 5 ratio (ref 0.0–5.0)
Cholesterol, Total: 288 mg/dL — ABNORMAL HIGH (ref 100–199)
HDL: 58 mg/dL (ref >39)
LDL Calculated: 208 mg/dL — ABNORMAL HIGH (ref 0–99)
Triglycerides: 109 mg/dL (ref 0–149)
VLDL Cholesterol Cal: 22 mg/dL (ref 5–40)

## 2018-11-03 ENCOUNTER — Ambulatory Visit (INDEPENDENT_AMBULATORY_CARE_PROVIDER_SITE_OTHER): Payer: BC Managed Care – PPO

## 2018-11-03 ENCOUNTER — Other Ambulatory Visit: Payer: Self-pay

## 2018-11-03 DIAGNOSIS — I10 Essential (primary) hypertension: Secondary | ICD-10-CM | POA: Diagnosis not present

## 2018-11-03 DIAGNOSIS — R0789 Other chest pain: Secondary | ICD-10-CM | POA: Diagnosis not present

## 2018-11-04 DIAGNOSIS — Z113 Encounter for screening for infections with a predominantly sexual mode of transmission: Secondary | ICD-10-CM | POA: Diagnosis not present

## 2018-11-04 DIAGNOSIS — I10 Essential (primary) hypertension: Secondary | ICD-10-CM | POA: Diagnosis not present

## 2018-11-04 DIAGNOSIS — Z0000 Encounter for general adult medical examination without abnormal findings: Secondary | ICD-10-CM | POA: Diagnosis not present

## 2018-11-04 DIAGNOSIS — E7800 Pure hypercholesterolemia, unspecified: Secondary | ICD-10-CM | POA: Insufficient documentation

## 2018-11-04 DIAGNOSIS — N489 Disorder of penis, unspecified: Secondary | ICD-10-CM | POA: Diagnosis not present

## 2018-11-11 DIAGNOSIS — R21 Rash and other nonspecific skin eruption: Secondary | ICD-10-CM | POA: Diagnosis not present

## 2018-11-29 ENCOUNTER — Ambulatory Visit: Payer: BC Managed Care – PPO | Admitting: Cardiology

## 2018-12-27 ENCOUNTER — Other Ambulatory Visit: Payer: Self-pay

## 2018-12-27 ENCOUNTER — Ambulatory Visit (INDEPENDENT_AMBULATORY_CARE_PROVIDER_SITE_OTHER): Payer: BC Managed Care – PPO

## 2018-12-27 DIAGNOSIS — R0789 Other chest pain: Secondary | ICD-10-CM

## 2019-01-03 ENCOUNTER — Ambulatory Visit: Payer: BC Managed Care – PPO | Admitting: Cardiology

## 2019-01-04 ENCOUNTER — Other Ambulatory Visit: Payer: Self-pay

## 2019-01-04 ENCOUNTER — Ambulatory Visit (INDEPENDENT_AMBULATORY_CARE_PROVIDER_SITE_OTHER): Payer: BC Managed Care – PPO | Admitting: Cardiology

## 2019-01-04 ENCOUNTER — Encounter: Payer: Self-pay | Admitting: Cardiology

## 2019-01-04 VITALS — BP 139/92 | HR 86 | Ht 73.0 in | Wt 242.0 lb

## 2019-01-04 DIAGNOSIS — E782 Mixed hyperlipidemia: Secondary | ICD-10-CM | POA: Diagnosis not present

## 2019-01-04 DIAGNOSIS — R0789 Other chest pain: Secondary | ICD-10-CM

## 2019-01-04 DIAGNOSIS — I10 Essential (primary) hypertension: Secondary | ICD-10-CM | POA: Diagnosis not present

## 2019-01-04 MED ORDER — ATORVASTATIN CALCIUM 20 MG PO TABS: 20.0000 mg | ORAL_TABLET | Freq: Every day | ORAL | 2 refills | Status: DC

## 2019-01-04 NOTE — Progress Notes (Signed)
Primary Physician:  Memory Dance, NP   Patient ID: Jason [name], male    DOB: [birth year], 33 y.o.   MRN: [id number]  Subjective:    Chief Complaint  Patient presents with  . Hypertension  . Follow-up    echo&GXT    HPI: Jason [name]  is a 33 y.o. male  with hypertension, occasional cigar and cocaine use, recently evaluated by [place] for chest pain.   Patient symptoms of chest pain are felt to be atypical and potentially related to hypertension. In addition to his lisinopril hydrochlorothiazide, he was started on amlodipine. He underwent treadmill stress testing and echocardiogram and now presents to discuss results.  He has had some improvement in his chest pain, but does continue to have occasional twinges of chest pain at rest. His blood pressure has improved, but also continues to be slightly elevated with diastolic readings in the mid to low 90s. Tolerating medications well.  Lipid panel was performed, and was noted to be markedly elevated. I recommended he start medical therapy, but patient did not receive my message.  He does drink alcohol, probably heavy over the weekend. He does occasionally smoke cigars when he drinks. He is trying to make some changes in regards to this. No recent cocaine use.  He has not exercised for the last 6 months due to the pandemic.  Past Medical History:  Diagnosis Date  . HTN (hypertension) 10/07/2018  . Hypertension     Past Surgical History:  Procedure Laterality Date  . KNEE SURGERY      Social History   Socioeconomic History  . Marital status: Single    Spouse name: Not on file  . Number of children: 2  . Years of education: Not on file  . Highest education level: Not on file  Occupational History  . Not on file  Social Needs  . Financial resource strain: Not on file  . Food insecurity    Worry: Not on file    Inability: Not on file  . Transportation needs    Medical: Not on file    Non-medical: Not on file  Tobacco  Use  . Smoking status: Current Every Day Smoker    Types: Cigars  . Smokeless tobacco: Never Used  Substance and Sexual Activity  . Alcohol use: Yes  . Drug use: Not on file  . Sexual activity: Not on file  Lifestyle  . Physical activity    Days per week: Not on file    Minutes per session: Not on file  . Stress: Not on file  Relationships  . Social Herbalist on phone: Not on file    Gets together: Not on file    Attends religious service: Not on file    Active member of club or organization: Not on file    Attends meetings of clubs or organizations: Not on file    Relationship status: Not on file  . Intimate partner violence    Fear of current or ex partner: Not on file    Emotionally abused: Not on file    Physically abused: Not on file    Forced sexual activity: Not on file  Other Topics Concern  . Not on file  Social History Narrative  . Not on file    Review of Systems  Constitution: Negative for decreased appetite, malaise/fatigue, weight gain and weight loss.  Eyes: Negative for visual disturbance.  Cardiovascular: Positive for chest pain. Negative for claudication, dyspnea on exertion,  leg swelling, orthopnea, palpitations and syncope.  Respiratory: Negative for hemoptysis and wheezing.   Endocrine: Negative for cold intolerance and heat intolerance.  Hematologic/Lymphatic: Does not bruise/bleed easily.  Skin: Negative for nail changes.  Musculoskeletal: Negative for muscle weakness and myalgias.  Gastrointestinal: Negative for abdominal pain, change in bowel habit, nausea and vomiting.  Neurological: Negative for difficulty with concentration, dizziness, focal weakness and headaches.  Psychiatric/Behavioral: Negative for altered mental status and suicidal ideas.  All other systems reviewed and are negative.     Objective:  Blood pressure (!) 139/92, pulse 86, height 6\' 1"  (1.854 m), weight 242 lb (109.8 kg), SpO2 98 %. Body mass index is 31.93  kg/m.    Physical Exam  Constitutional: He is oriented to person, place, and time. Vital signs are normal. He appears well-developed and well-nourished.  HENT:  Head: Normocephalic and atraumatic.  Neck: Normal range of motion.  Cardiovascular: Normal rate, regular rhythm, normal heart sounds and intact distal pulses.  Pulmonary/Chest: Effort normal and breath sounds normal. No accessory muscle usage. No respiratory distress.  Abdominal: Soft. Bowel sounds are normal.  Musculoskeletal: Normal range of motion.  Neurological: He is alert and oriented to person, place, and time.  Skin: Skin is warm and dry.  Vitals reviewed.  Radiology: No results found.  Laboratory examination:    CMP Latest Ref Rng & Units 09/27/2018 12/27/2014 09/13/2012  Glucose 70 - 99 mg/dL 92 161(W101(H) 87  BUN 6 - 20 mg/dL 13 13 11   Creatinine 0.61 - 1.24 mg/dL 9.601.17 4.541.22 0.981.10  Sodium 135 - 145 mmol/L 138 141 139  Potassium 3.5 - 5.1 mmol/L 3.6 4.0 3.9  Chloride 98 - 111 mmol/L 99 105 104  CO2 22 - 32 mmol/L 27 26 27   Calcium 8.9 - 10.3 mg/dL 9.8 9.9 9.6   CBC Latest Ref Rng & Units 09/27/2018 12/27/2014 09/13/2012  WBC 4.0 - 10.5 K/uL 4.2 3.9(L) 3.1(L)  Hemoglobin 13.0 - 17.0 g/dL 11.915.9 14.715.6 82.915.9  Hematocrit 39.0 - 52.0 % 51.2 47.3 48.1  Platelets 150 - 400 K/uL 254 216 206   Lipid Panel     Component Value Date/Time   CHOL 288 (H) 10/11/2018 1050   TRIG 109 10/11/2018 1050   HDL 58 10/11/2018 1050   CHOLHDL 5.0 10/11/2018 1050   LDLCALC 208 (H) 10/11/2018 1050   HEMOGLOBIN A1C No results found for: HGBA1C, MPG TSH No results for input(s): TSH in the last 8760 hours.  PRN Meds:. There are no discontinued medications. Current Meds  Medication Sig  . amLODipine (NORVASC) 10 MG tablet Take 1 tablet (10 mg total) by mouth daily.  . Cyanocobalamin (VITAMIN B-12 PO) Take by mouth daily.  Marland Kitchen. lisinopril-hydrochlorothiazide (ZESTORETIC) 20-25 MG tablet Take 1 tablet by mouth daily.    Cardiac Studies:    Echocardiogram 11/04/2018: Left ventricle cavity is normal in size. Mild concentric hypertrophy of the left ventricle. Normal LV systolic function with EF 58%. Normal global wall motion. Normal diastolic filling pattern. Calculated EF 58%. Mild tricuspid regurgitation.  No evidence of pulmonary hypertension.  Exercise treadmill stress test 12/27/2018: Exercise treadmill stress test performed using Bruce protocol.  Patient reached 13.4 METS, and 99% of age predicted maximum heart rate.  Exercise capacity was excellent.  No chest pain reported.  Normal heart rate and hemodynamic response.  Peak ECG demonstrated sinus tachycardia.  ST-T wave abnormalities associated with RBBB. No ischemic changes seen. Low risk treadmill stress test.  Assessment:     ICD-10-CM   1.  Essential hypertension  I10   2. Atypical chest pain  R07.89   3. Mixed hyperlipidemia  E78.2     EKG 10/11/2018: Normal sinus rhythm at 68 bpm, rightward axis, IRBBB, no evidence of ischemia.  Recommendations:   Blood pressure has improved with addition of amlodipine, but has continued to be elevated. I have recommended starting additional medication either Aldactone or Bystolic would be relatively good options; however, patient is very reluctant to this and feels that he can improve his blood pressure with resuming regular exercise. Although I feel that his blood pressure will likely not improve with this, will allow him to try to see if this will make any difference in his blood pressure reevaluate at his next appointment.  I have discussed his treadmill stress test and echocardiogram results which were essentially normal except for mild LVH on echocardiogram. I have again stressed the importance of blood pressure control and continue to feel that his chest pain is related to this.  His lipids were noted to be markedly elevated, suspect familial component to his hyperlipidemia. He is agreeable to starting Lipitor 20 mg daily. He  will need repeat lipids in the next 8 weeks for follow-up. We'll plan on ordering this at his next appointment. Discussed diet changes to also help with this. Encouraged him to remain abstinent from cocaine and cigar use. Also encouraged him to cut back on alcohol use. I will see him back in 6 weeks to follow-up on hypertension.   Toniann Fail, MSN, APRN, FNP-C [facility] [place] Office: [phone] Fax: [phone]

## 2019-02-15 ENCOUNTER — Ambulatory Visit: Payer: BC Managed Care – PPO | Admitting: Cardiology

## 2019-03-15 ENCOUNTER — Ambulatory Visit: Payer: BC Managed Care – PPO | Admitting: Cardiology

## 2019-03-15 NOTE — Progress Notes (Deleted)
Primary Physician:  Stevphen Rochester, NP   Patient ID: Jason [name], male    DOB: 02-Dec-1985, 34 y.o.   MRN: [id number]  Subjective:    No chief complaint on file.   HPI: Jason [name]  is a 34 y.o. male  with hypertension, occasional cigar and cocaine use, recently evaluated by [place] for chest pain.   Patient symptoms of chest pain are felt to be atypical and potentially related to hypertension. In addition to his lisinopril hydrochlorothiazide, he was started on amlodipine. He underwent treadmill stress testing and echocardiogram and now presents to discuss results.  He has had some improvement in his chest pain, but does continue to have occasional twinges of chest pain at rest. His blood pressure has improved, but also continues to be slightly elevated with diastolic readings in the mid to low 90s. Tolerating medications well.  Lipid panel was performed, and was noted to be markedly elevated. I recommended he start medical therapy, but patient did not receive my message.  He does drink alcohol, probably heavy over the weekend. He does occasionally smoke cigars when he drinks. He is trying to make some changes in regards to this. No recent cocaine use.  He has not exercised for the last 6 months due to the pandemic.  Past Medical History:  Diagnosis Date  . HTN (hypertension) 10/07/2018  . Hypertension     Past Surgical History:  Procedure Laterality Date  . KNEE SURGERY      Social History   Socioeconomic History  . Marital status: Single    Spouse name: Not on file  . Number of children: 2  . Years of education: Not on file  . Highest education level: Not on file  Occupational History  . Not on file  Tobacco Use  . Smoking status: Current Every Day Smoker    Types: Cigars  . Smokeless tobacco: Never Used  Substance and Sexual Activity  . Alcohol use: Yes  . Drug use: Not on file  . Sexual activity: Not on file  Other Topics Concern  . Not on file  Social  History Narrative  . Not on file   Social Determinants of Health   Financial Resource Strain:   . Difficulty of Paying Living Expenses: Not on file  Food Insecurity:   . Worried About Programme researcher, broadcasting/film/video in the Last Year: Not on file  . Ran Out of Food in the Last Year: Not on file  Transportation Needs:   . Lack of Transportation (Medical): Not on file  . Lack of Transportation (Non-Medical): Not on file  Physical Activity:   . Days of Exercise per Week: Not on file  . Minutes of Exercise per Session: Not on file  Stress:   . Feeling of Stress : Not on file  Social Connections:   . Frequency of Communication with Friends and Family: Not on file  . Frequency of Social Gatherings with Friends and Family: Not on file  . Attends Religious Services: Not on file  . Active Member of Clubs or Organizations: Not on file  . Attends Banker Meetings: Not on file  . Marital Status: Not on file  Intimate Partner Violence:   . Fear of Current or Ex-Partner: Not on file  . Emotionally Abused: Not on file  . Physically Abused: Not on file  . Sexually Abused: Not on file    Review of Systems  Constitution: Negative for decreased appetite, malaise/fatigue, weight gain and weight loss.  Eyes: Negative for visual disturbance.  Cardiovascular: Positive for chest pain. Negative for claudication, dyspnea on exertion, leg swelling, orthopnea, palpitations and syncope.  Respiratory: Negative for hemoptysis and wheezing.   Endocrine: Negative for cold intolerance and heat intolerance.  Hematologic/Lymphatic: Does not bruise/bleed easily.  Skin: Negative for nail changes.  Musculoskeletal: Negative for muscle weakness and myalgias.  Gastrointestinal: Negative for abdominal pain, change in bowel habit, nausea and vomiting.  Neurological: Negative for difficulty with concentration, dizziness, focal weakness and headaches.  Psychiatric/Behavioral: Negative for altered mental status and  suicidal ideas.  All other systems reviewed and are negative.     Objective:  There were no vitals taken for this visit. There is no height or weight on file to calculate BMI.    Physical Exam  Constitutional: He is oriented to person, place, and time. Vital signs are normal. He appears well-developed and well-nourished.  HENT:  Head: Normocephalic and atraumatic.  Cardiovascular: Normal rate, regular rhythm, normal heart sounds and intact distal pulses.  Pulmonary/Chest: Effort normal and breath sounds normal. No accessory muscle usage. No respiratory distress.  Abdominal: Soft. Bowel sounds are normal.  Musculoskeletal:        General: Normal range of motion.     Cervical back: Normal range of motion.  Neurological: He is alert and oriented to person, place, and time.  Skin: Skin is warm and dry.  Vitals reviewed.  Radiology: No results found.  Laboratory examination:    CMP Latest Ref Rng & Units 09/27/2018 12/27/2014 09/13/2012  Glucose 70 - 99 mg/dL 92 101(H) 87  BUN 6 - 20 mg/dL 13 13 11   Creatinine 0.61 - 1.24 mg/dL 1.17 1.22 1.10  Sodium 135 - 145 mmol/L 138 141 139  Potassium 3.5 - 5.1 mmol/L 3.6 4.0 3.9  Chloride 98 - 111 mmol/L 99 105 104  CO2 22 - 32 mmol/L 27 26 27   Calcium 8.9 - 10.3 mg/dL 9.8 9.9 9.6   CBC Latest Ref Rng & Units 09/27/2018 12/27/2014 09/13/2012  WBC 4.0 - 10.5 K/uL 4.2 3.9(L) 3.1(L)  Hemoglobin 13.0 - 17.0 g/dL 15.9 15.6 15.9  Hematocrit 39.0 - 52.0 % 51.2 47.3 48.1  Platelets 150 - 400 K/uL 254 216 206   Lipid Panel     Component Value Date/Time   CHOL 288 (H) 10/11/2018 1050   TRIG 109 10/11/2018 1050   HDL 58 10/11/2018 1050   CHOLHDL 5.0 10/11/2018 1050   LDLCALC 208 (H) 10/11/2018 1050   HEMOGLOBIN A1C No results found for: HGBA1C, MPG TSH No results for input(s): TSH in the last 8760 hours.  PRN Meds:. There are no discontinued medications. No outpatient medications have been marked as taking for the 03/15/19 encounter  (Appointment) with Miquel Dunn, NP.    Cardiac Studies:   Echocardiogram 11/04/2018: Left ventricle cavity is normal in size. Mild concentric hypertrophy of the left ventricle. Normal LV systolic function with EF 58%. Normal global wall motion. Normal diastolic filling pattern. Calculated EF 58%. Mild tricuspid regurgitation.  No evidence of pulmonary hypertension.  Exercise treadmill stress test 12/27/2018: Exercise treadmill stress test performed using Bruce protocol.  Patient reached 13.4 METS, and 99% of age predicted maximum heart rate.  Exercise capacity was excellent.  No chest pain reported.  Normal heart rate and hemodynamic response.  Peak ECG demonstrated sinus tachycardia.  ST-T wave abnormalities associated with RBBB. No ischemic changes seen. Low risk treadmill stress test.  Assessment:   No diagnosis found.  EKG 10/11/2018: Normal sinus rhythm  at 68 bpm, rightward axis, IRBBB, no evidence of ischemia.  Recommendations:   Blood pressure has improved with addition of amlodipine, but has continued to be elevated. I have recommended starting additional medication either Aldactone or Bystolic would be relatively good options; however, patient is very reluctant to this and feels that he can improve his blood pressure with resuming regular exercise. Although I feel that his blood pressure will likely not improve with this, will allow him to try to see if this will make any difference in his blood pressure reevaluate at his next appointment.  I have discussed his treadmill stress test and echocardiogram results which were essentially normal except for mild LVH on echocardiogram. I have again stressed the importance of blood pressure control and continue to feel that his chest pain is related to this.  His lipids were noted to be markedly elevated, suspect familial component to his hyperlipidemia. He is agreeable to starting Lipitor 20 mg daily. He will need repeat lipids  in the next 8 weeks for follow-up. We'll plan on ordering this at his next appointment. Discussed diet changes to also help with this. Encouraged him to remain abstinent from cocaine and cigar use. Also encouraged him to cut back on alcohol use. I will see him back in 6 weeks to follow-up on hypertension.   Toniann Fail, MSN, APRN, FNP-C [facility] Office: [phone] Fax: [phone]

## 2019-06-14 DIAGNOSIS — M545 Low back pain: Secondary | ICD-10-CM | POA: Diagnosis not present

## 2019-09-22 ENCOUNTER — Encounter (HOSPITAL_COMMUNITY): Payer: Self-pay | Admitting: Emergency Medicine

## 2019-09-22 ENCOUNTER — Emergency Department (HOSPITAL_COMMUNITY): Payer: Worker's Compensation

## 2019-09-22 ENCOUNTER — Other Ambulatory Visit: Payer: Self-pay

## 2019-09-22 ENCOUNTER — Emergency Department (HOSPITAL_COMMUNITY)
Admission: EM | Admit: 2019-09-22 | Discharge: 2019-09-22 | Disposition: A | Discharge status: Home / Self Care | Payer: Worker's Compensation | Attending: Emergency Medicine | Admitting: Emergency Medicine

## 2019-09-22 DIAGNOSIS — I10 Essential (primary) hypertension: Secondary | ICD-10-CM | POA: Diagnosis not present

## 2019-09-22 DIAGNOSIS — M25461 Effusion, right knee: Secondary | ICD-10-CM | POA: Insufficient documentation

## 2019-09-22 DIAGNOSIS — F17290 Nicotine dependence, other tobacco product, uncomplicated: Secondary | ICD-10-CM | POA: Diagnosis not present

## 2019-09-22 DIAGNOSIS — M25561 Pain in right knee: Secondary | ICD-10-CM | POA: Diagnosis not present

## 2019-09-22 MED ORDER — HYDROCODONE-ACETAMINOPHEN 5-325 MG PO TABS: 1.0000 | ORAL_TABLET | ORAL | 0 refills | Status: DC | PRN

## 2019-09-22 NOTE — ED Provider Notes (Signed)
[facility] EMERGENCY DEPARTMENT Provider Note   CSN: [id number] Arrival date & time: 09/22/19  9892     History Chief Complaint  Patient presents with  . Knee Pain    Jason [name] is a 34 y.o. male.  Pt reports he fell on 7/13. Pt complains of continued leg pain and decreased ability to walk   The history is provided by the patient. No language interpreter was used.  Knee Pain Pain details:    Quality:  Aching   Radiates to:  Does not radiate   Severity:  Moderate   Onset quality:  Gradual   Timing:  Constant   Progression:  Worsening Chronicity:  New Relieved by:  Nothing Worsened by:  Nothing Ineffective treatments:  None tried Risk factors: no concern for non-accidental trauma        Past Medical History:  Diagnosis Date  . HTN (hypertension) 10/07/2018  . Hypertension     Patient Active Problem List   Diagnosis Date Noted  . Hypercholesteremia 11/04/2018  . HTN (hypertension) 10/07/2018    Past Surgical History:  Procedure Laterality Date  . KNEE SURGERY         No family history on file.  Social History   Tobacco Use  . Smoking status: Current Every Day Smoker    Types: Cigars  . Smokeless tobacco: Never Used  Vaping Use  . Vaping Use: Never used  Substance Use Topics  . Alcohol use: Yes  . Drug use: Not on file    Home Medications Prior to Admission medications   Medication Sig Start Date End Date Taking? Authorizing Provider  amLODipine (NORVASC) 10 MG tablet Take 1 tablet (10 mg total) by mouth daily. 10/11/18 01/09/19  Toniann Fail, NP  atorvastatin (LIPITOR) 20 MG tablet Take 1 tablet (20 mg total) by mouth daily. 01/04/19 04/04/19  Toniann Fail, NP  Cyanocobalamin (VITAMIN B-12 PO) Take by mouth daily.    [provider]  lisinopril-hydrochlorothiazide (ZESTORETIC) 20-25 MG tablet Take 1 tablet by mouth daily.    [provider]    Allergies    Patient has no known  allergies.  Review of Systems   Review of Systems  All other systems reviewed and are negative.   Physical Exam Updated Vital Signs BP (!) 140/105 (BP Location: Right Arm)   Pulse 82   Temp 98.4 F (36.9 C) (Oral)   Resp 16   SpO2 95%   Physical Exam Vitals reviewed.  Constitutional:      Appearance: Normal appearance.  Musculoskeletal:        General: Swelling and tenderness present.     Comments: Swollen tender right knee, pain with range of motion  nv and ns intact   Skin:    General: Skin is warm.  Neurological:     General: No focal deficit present.     Mental Status: He is alert.  Psychiatric:        Mood and Affect: Mood normal.     ED Results / Procedures / Treatments   Labs (all labs ordered are listed, but only abnormal results are displayed) Labs Reviewed - No data to display  EKG None  Radiology DG Knee Complete 4 Views Right  Result Date: 09/22/2019 CLINICAL DATA:  Right knee pain after fall. Remote history of patellar fracture EXAM: RIGHT KNEE - COMPLETE 4+ VIEW COMPARISON:  None. FINDINGS: Posttraumatic changes of a transversely oriented fracture through the upper pole of the patella with corticated  margins compatible with history of remote prior patellar fracture. Small mineralized and metallic densities project within the suprapatellar pouch. No definite acute fracture component is evident. There is a small joint effusion. Joint spaces are maintained. There is prepatellar soft tissue swelling. IMPRESSION: 1. Posttraumatic changes of a remote prior patellar fracture without a definite acute fracture component. 2. Small joint effusion. 3. Prepatellar soft tissue swelling. 4. Debris including small metallic densities project within the suprapatellar pouch. Correlate with prior surgical history or prior penetrating injury. Electronically Signed   By: Duanne Guess D.O.   On: 09/22/2019 08:04    Procedures Procedures (including critical care  time)  Medications Ordered in ED Medications - No data to display  ED Course  I have reviewed the triage vital signs and the nursing notes.  Pertinent labs & imaging results that were available during my care of the patient were reviewed by me and considered in my medical decision making (see chart for details).    MDM Rules/Calculators/A&P                          MDM:  Pt placed ina knee imbolizer and crutches.  Pt advised to follow up with Dr. Ranell Patrick for evaluation Final Clinical Impression(s) / ED Diagnoses Final diagnoses:  Acute pain of right knee  Effusion of right knee    Rx / DC Orders ED Discharge Orders         Ordered    HYDROcodone-acetaminophen (NORCO/VICODIN) 5-325 MG tablet  Every 4 hours PRN     Discontinue  Reprint     09/22/19 0825        An After Visit Summary was printed and given to the patient.    Osie Cheeks 09/22/19 Theodis Blaze    Eber Hong, MD 09/23/19 [phone]

## 2019-09-22 NOTE — Discharge Instructions (Signed)
Return if any problems.

## 2019-09-22 NOTE — ED Triage Notes (Signed)
C/o R knee pain and swelling since 7/13.  States he fell when getting out of his truck.

## 2019-09-22 NOTE — Progress Notes (Signed)
Orthopedic Tech Progress Note Patient Details:  Jason [name] 06-11-1985 [id number]  Ortho Devices Type of Ortho Device: Knee Immobilizer, Crutches Ortho Device/Splint Location: RLE Ortho Device/Splint Interventions: Ordered, Adjustment   Post Interventions Patient Tolerated: Well Instructions Provided: Care of device, Adjustment of device, Poper ambulation with device   Demarie Uhlig 09/22/2019, 8:47 AM

## 2019-10-06 ENCOUNTER — Other Ambulatory Visit: Payer: Self-pay | Admitting: Orthopedic Surgery

## 2019-10-06 DIAGNOSIS — M2391 Unspecified internal derangement of right knee: Secondary | ICD-10-CM

## 2019-10-16 ENCOUNTER — Other Ambulatory Visit: Payer: Self-pay

## 2019-10-16 ENCOUNTER — Ambulatory Visit
Admission: RE | Admit: 2019-10-16 | Discharge: 2019-10-16 | Disposition: A | Discharge status: Home / Self Care | Payer: BC Managed Care – PPO | Source: Ambulatory Visit | Attending: Orthopedic Surgery | Admitting: Orthopedic Surgery

## 2019-10-16 DIAGNOSIS — M2391 Unspecified internal derangement of right knee: Secondary | ICD-10-CM

## 2019-10-16 DIAGNOSIS — M25561 Pain in right knee: Secondary | ICD-10-CM | POA: Diagnosis not present

## 2019-12-02 DIAGNOSIS — Z831 Family history of other infectious and parasitic diseases: Secondary | ICD-10-CM | POA: Diagnosis not present

## 2019-12-02 DIAGNOSIS — I10 Essential (primary) hypertension: Secondary | ICD-10-CM | POA: Diagnosis not present

## 2019-12-02 DIAGNOSIS — E7800 Pure hypercholesterolemia, unspecified: Secondary | ICD-10-CM | POA: Diagnosis not present

## 2020-01-10 ENCOUNTER — Observation Stay (HOSPITAL_BASED_OUTPATIENT_CLINIC_OR_DEPARTMENT_OTHER)
Admission: EM | Admit: 2020-01-10 | Discharge: 2020-01-11 | Disposition: A | Discharge status: Home / Self Care | Payer: BC Managed Care – PPO | Attending: General Surgery | Admitting: General Surgery

## 2020-01-10 ENCOUNTER — Emergency Department (HOSPITAL_COMMUNITY): Payer: BC Managed Care – PPO | Admitting: Anesthesiology

## 2020-01-10 ENCOUNTER — Encounter (HOSPITAL_BASED_OUTPATIENT_CLINIC_OR_DEPARTMENT_OTHER): Payer: Self-pay | Admitting: *Deleted

## 2020-01-10 ENCOUNTER — Emergency Department (HOSPITAL_BASED_OUTPATIENT_CLINIC_OR_DEPARTMENT_OTHER): Payer: BC Managed Care – PPO

## 2020-01-10 ENCOUNTER — Other Ambulatory Visit: Payer: Self-pay

## 2020-01-10 ENCOUNTER — Encounter (HOSPITAL_COMMUNITY)
Admission: EM | Disposition: A | Discharge status: Home / Self Care | Payer: Self-pay | Source: Home / Self Care | Attending: Emergency Medicine

## 2020-01-10 DIAGNOSIS — R109 Unspecified abdominal pain: Secondary | ICD-10-CM | POA: Diagnosis not present

## 2020-01-10 DIAGNOSIS — K353 Acute appendicitis with localized peritonitis, without perforation or gangrene: Principal | ICD-10-CM

## 2020-01-10 DIAGNOSIS — K3580 Unspecified acute appendicitis: Secondary | ICD-10-CM | POA: Diagnosis not present

## 2020-01-10 DIAGNOSIS — I10 Essential (primary) hypertension: Secondary | ICD-10-CM | POA: Insufficient documentation

## 2020-01-10 DIAGNOSIS — F17290 Nicotine dependence, other tobacco product, uncomplicated: Secondary | ICD-10-CM | POA: Insufficient documentation

## 2020-01-10 DIAGNOSIS — E7800 Pure hypercholesterolemia, unspecified: Secondary | ICD-10-CM | POA: Diagnosis not present

## 2020-01-10 DIAGNOSIS — Z79899 Other long term (current) drug therapy: Secondary | ICD-10-CM | POA: Diagnosis not present

## 2020-01-10 DIAGNOSIS — Z20822 Contact with and (suspected) exposure to covid-19: Secondary | ICD-10-CM | POA: Insufficient documentation

## 2020-01-10 DIAGNOSIS — R1031 Right lower quadrant pain: Secondary | ICD-10-CM

## 2020-01-10 HISTORY — PX: LAPAROSCOPIC APPENDECTOMY: SHX408

## 2020-01-10 LAB — CBC
HCT: 49.7 % (ref 39.0–52.0)
Hemoglobin: 16.1 g/dL (ref 13.0–17.0)
MCH: 27.4 pg (ref 26.0–34.0)
MCHC: 32.4 g/dL (ref 30.0–36.0)
MCV: 84.5 fL (ref 80.0–100.0)
Platelets: 306 10*3/uL (ref 150–400)
RBC: 5.88 MIL/uL — ABNORMAL HIGH (ref 4.22–5.81)
RDW: 12.7 % (ref 11.5–15.5)
WBC: 8.5 10*3/uL (ref 4.0–10.5)
nRBC: 0 % (ref 0.0–0.2)

## 2020-01-10 LAB — COMPREHENSIVE METABOLIC PANEL
ALT: 43 U/L (ref 0–44)
AST: 36 U/L (ref 15–41)
Albumin: 4.7 g/dL (ref 3.5–5.0)
Alkaline Phosphatase: 72 U/L (ref 38–126)
Anion gap: 12 (ref 5–15)
BUN: 10 mg/dL (ref 6–20)
CO2: 23 mmol/L (ref 22–32)
Calcium: 9.9 mg/dL (ref 8.9–10.3)
Chloride: 101 mmol/L (ref 98–111)
Creatinine, Ser: 1.11 mg/dL (ref 0.61–1.24)
GFR, Estimated: >60 mL/min (ref >60)
Glucose, Bld: 110 mg/dL — ABNORMAL HIGH (ref 70–99)
Potassium: 3.6 mmol/L (ref 3.5–5.1)
Sodium: 136 mmol/L (ref 135–145)
Total Bilirubin: 0.7 mg/dL (ref 0.3–1.2)
Total Protein: 8.8 g/dL — ABNORMAL HIGH (ref 6.5–8.1)

## 2020-01-10 LAB — RESPIRATORY PANEL BY RT PCR (FLU A&B, COVID)
Influenza A by PCR: NEGATIVE
Influenza B by PCR: NEGATIVE
SARS Coronavirus 2 by RT PCR: NEGATIVE

## 2020-01-10 LAB — LIPASE, BLOOD: Lipase: 32 U/L (ref 11–51)

## 2020-01-10 SURGERY — APPENDECTOMY, LAPAROSCOPIC
Anesthesia: General | Site: Abdomen

## 2020-01-10 MED ORDER — SODIUM CHLORIDE 0.9 % IV SOLN: INTRAVENOUS | Status: DC

## 2020-01-10 MED ORDER — DEXAMETHASONE SODIUM PHOSPHATE 10 MG/ML IJ SOLN
INTRAMUSCULAR | Status: DC | PRN
  Administered 2020-01-10: 10 mg via INTRAVENOUS

## 2020-01-10 MED ORDER — BUPIVACAINE HCL (PF) 0.5 % IJ SOLN
INTRAMUSCULAR | Status: DC | PRN
  Administered 2020-01-10: 20 mL

## 2020-01-10 MED ORDER — HYDROMORPHONE HCL 1 MG/ML IJ SOLN
1.0000 mg | Freq: Once | INTRAMUSCULAR | Status: AC
  Administered 2020-01-10: 1 mg via INTRAVENOUS
  Filled 2020-01-10: qty 1

## 2020-01-10 MED ORDER — KETOROLAC TROMETHAMINE 30 MG/ML IJ SOLN
INTRAMUSCULAR | Status: AC
  Filled 2020-01-10: qty 1

## 2020-01-10 MED ORDER — LIDOCAINE 2% (20 MG/ML) 5 ML SYRINGE
INTRAMUSCULAR | Status: DC | PRN
  Administered 2020-01-10: 60 mg via INTRAVENOUS

## 2020-01-10 MED ORDER — SUGAMMADEX SODIUM 500 MG/5ML IV SOLN
INTRAVENOUS | Status: AC
  Filled 2020-01-10: qty 5

## 2020-01-10 MED ORDER — IOHEXOL 300 MG/ML  SOLN
100.0000 mL | Freq: Once | INTRAMUSCULAR | Status: AC
  Administered 2020-01-10: 100 mL via INTRAVENOUS

## 2020-01-10 MED ORDER — BUPIVACAINE HCL (PF) 0.5 % IJ SOLN
INTRAMUSCULAR | Status: AC
  Filled 2020-01-10: qty 30

## 2020-01-10 MED ORDER — LACTATED RINGERS IV SOLN: INTRAVENOUS | Status: DC | PRN

## 2020-01-10 MED ORDER — MIDAZOLAM HCL 5 MG/5ML IJ SOLN
INTRAMUSCULAR | Status: DC | PRN
  Administered 2020-01-10: 2 mg via INTRAVENOUS

## 2020-01-10 MED ORDER — ONDANSETRON HCL 4 MG/2ML IJ SOLN
INTRAMUSCULAR | Status: AC
  Filled 2020-01-10: qty 2

## 2020-01-10 MED ORDER — ROCURONIUM BROMIDE 10 MG/ML (PF) SYRINGE
PREFILLED_SYRINGE | INTRAVENOUS | Status: AC
  Filled 2020-01-10: qty 10

## 2020-01-10 MED ORDER — ONDANSETRON HCL 4 MG/2ML IJ SOLN
INTRAMUSCULAR | Status: DC | PRN
  Administered 2020-01-10: 4 mg via INTRAVENOUS

## 2020-01-10 MED ORDER — FENTANYL CITRATE (PF) 100 MCG/2ML IJ SOLN: 25.0000 ug | INTRAMUSCULAR | Status: DC | PRN

## 2020-01-10 MED ORDER — AMISULPRIDE (ANTIEMETIC) 5 MG/2ML IV SOLN: 10.0000 mg | Freq: Once | INTRAVENOUS | Status: DC | PRN

## 2020-01-10 MED ORDER — KETOROLAC TROMETHAMINE 30 MG/ML IJ SOLN
30.0000 mg | Freq: Once | INTRAMUSCULAR | Status: AC | PRN
  Administered 2020-01-10: 30 mg via INTRAVENOUS

## 2020-01-10 MED ORDER — ACETAMINOPHEN 10 MG/ML IV SOLN: 1000.0000 mg | Freq: Once | INTRAVENOUS | Status: DC

## 2020-01-10 MED ORDER — SUGAMMADEX SODIUM 500 MG/5ML IV SOLN
INTRAVENOUS | Status: DC | PRN
  Administered 2020-01-10: 400 mg via INTRAVENOUS

## 2020-01-10 MED ORDER — PROPOFOL 10 MG/ML IV BOLUS
INTRAVENOUS | Status: AC
  Filled 2020-01-10: qty 20

## 2020-01-10 MED ORDER — ONDANSETRON HCL 4 MG/2ML IJ SOLN
4.0000 mg | Freq: Once | INTRAMUSCULAR | Status: AC
  Administered 2020-01-10: 4 mg via INTRAVENOUS
  Filled 2020-01-10: qty 2

## 2020-01-10 MED ORDER — FENTANYL CITRATE (PF) 250 MCG/5ML IJ SOLN
INTRAMUSCULAR | Status: AC
  Filled 2020-01-10: qty 5

## 2020-01-10 MED ORDER — LACTATED RINGERS IV SOLN
INTRAVENOUS | Status: AC | PRN
  Administered 2020-01-10: 1000 mL

## 2020-01-10 MED ORDER — MIDAZOLAM HCL 2 MG/2ML IJ SOLN
INTRAMUSCULAR | Status: AC
  Filled 2020-01-10: qty 2

## 2020-01-10 MED ORDER — FENTANYL CITRATE (PF) 250 MCG/5ML IJ SOLN
INTRAMUSCULAR | Status: DC | PRN
  Administered 2020-01-10: 150 ug via INTRAVENOUS
  Administered 2020-01-10 (×2): 100 ug via INTRAVENOUS

## 2020-01-10 MED ORDER — DEXAMETHASONE SODIUM PHOSPHATE 10 MG/ML IJ SOLN
INTRAMUSCULAR | Status: AC
  Filled 2020-01-10: qty 1

## 2020-01-10 MED ORDER — SUCCINYLCHOLINE CHLORIDE 200 MG/10ML IV SOSY
PREFILLED_SYRINGE | INTRAVENOUS | Status: AC
  Filled 2020-01-10: qty 10

## 2020-01-10 MED ORDER — LIDOCAINE 2% (20 MG/ML) 5 ML SYRINGE
INTRAMUSCULAR | Status: AC
  Filled 2020-01-10: qty 5

## 2020-01-10 MED ORDER — PIPERACILLIN-TAZOBACTAM 3.375 G IVPB 30 MIN
3.3750 g | Freq: Once | INTRAVENOUS | Status: AC
  Administered 2020-01-10: 3.375 g via INTRAVENOUS
  Filled 2020-01-10: qty 50

## 2020-01-10 MED ORDER — POTASSIUM CHLORIDE IN NACL 20-0.9 MEQ/L-% IV SOLN
INTRAVENOUS | Status: DC
  Filled 2020-01-10: qty 1000

## 2020-01-10 MED ORDER — PROPOFOL 10 MG/ML IV BOLUS
INTRAVENOUS | Status: DC | PRN
  Administered 2020-01-10: 200 mg via INTRAVENOUS

## 2020-01-10 MED ORDER — SUCCINYLCHOLINE CHLORIDE 200 MG/10ML IV SOSY
PREFILLED_SYRINGE | INTRAVENOUS | Status: DC | PRN
  Administered 2020-01-10: 120 mg via INTRAVENOUS

## 2020-01-10 MED ORDER — 0.9 % SODIUM CHLORIDE (POUR BTL) OPTIME
TOPICAL | Status: DC | PRN
  Administered 2020-01-10: 1000 mL

## 2020-01-10 MED ORDER — ROCURONIUM BROMIDE 10 MG/ML (PF) SYRINGE
PREFILLED_SYRINGE | INTRAVENOUS | Status: DC | PRN
  Administered 2020-01-10: 60 mg via INTRAVENOUS

## 2020-01-10 MED ORDER — SODIUM CHLORIDE 0.9 % IV BOLUS
1000.0000 mL | Freq: Once | INTRAVENOUS | Status: AC
  Administered 2020-01-10: 1000 mL via INTRAVENOUS

## 2020-01-10 MED ORDER — FENTANYL CITRATE (PF) 100 MCG/2ML IJ SOLN
INTRAMUSCULAR | Status: AC
  Filled 2020-01-10: qty 2

## 2020-01-10 SURGICAL SUPPLY — 32 items
APPLIER CLIP 5 13 M/L LIGAMAX5 (MISCELLANEOUS)
CHLORAPREP W/TINT 26 (MISCELLANEOUS) ×2 IMPLANT
CLIP APPLIE 5 13 M/L LIGAMAX5 (MISCELLANEOUS) IMPLANT
COVER SURGICAL LIGHT HANDLE (MISCELLANEOUS) ×2 IMPLANT
COVER WAND RF STERILE (DRAPES) IMPLANT
CUTTER FLEX LINEAR 45M (STAPLE) ×2 IMPLANT
DECANTER SPIKE VIAL GLASS SM (MISCELLANEOUS) ×2 IMPLANT
DERMABOND ADVANCED (GAUZE/BANDAGES/DRESSINGS) ×1
DERMABOND ADVANCED .7 DNX12 (GAUZE/BANDAGES/DRESSINGS) ×1 IMPLANT
DRAPE LAPAROSCOPIC ABDOMINAL (DRAPES) ×2 IMPLANT
ELECT COAG MONOPOLAR (ELECTROSURGICAL) ×2
ELECT REM PT RETURN 15FT ADLT (MISCELLANEOUS) ×2 IMPLANT
ELECTRODE COAG MONOPOLAR (ELECTROSURGICAL) ×1 IMPLANT
GLOVE BIO SURGEON STRL SZ7.5 (GLOVE) ×2 IMPLANT
GOWN STRL REUS W/TWL XL LVL3 (GOWN DISPOSABLE) ×4 IMPLANT
KIT BASIN OR (CUSTOM PROCEDURE TRAY) ×2 IMPLANT
KIT TURNOVER KIT A (KITS) IMPLANT
PENCIL SMOKE EVACUATOR (MISCELLANEOUS) IMPLANT
POUCH SPECIMEN RETRIEVAL 10MM (ENDOMECHANICALS) ×2 IMPLANT
RELOAD 45 VASCULAR/THIN (ENDOMECHANICALS) IMPLANT
RELOAD STAPLE TA45 3.5 REG BLU (ENDOMECHANICALS) ×2 IMPLANT
SET IRRIG TUBING LAPAROSCOPIC (IRRIGATION / IRRIGATOR) ×2 IMPLANT
SET TUBE SMOKE EVAC HIGH FLOW (TUBING) ×2 IMPLANT
SHEARS HARMONIC ACE PLUS 36CM (ENDOMECHANICALS) IMPLANT
SLEEVE XCEL OPT CAN 5 100 (ENDOMECHANICALS) ×2 IMPLANT
SUT MNCRL AB 4-0 PS2 18 (SUTURE) ×2 IMPLANT
TOWEL OR 17X26 10 PK STRL BLUE (TOWEL DISPOSABLE) ×2 IMPLANT
TOWEL OR NON WOVEN STRL DISP B (DISPOSABLE) ×2 IMPLANT
TRAY FOLEY MTR SLVR 16FR STAT (SET/KITS/TRAYS/PACK) ×2 IMPLANT
TRAY LAPAROSCOPIC (CUSTOM PROCEDURE TRAY) ×2 IMPLANT
TROCAR BLADELESS OPT 5 100 (ENDOMECHANICALS) ×2 IMPLANT
TROCAR XCEL BLUNT TIP 100MML (ENDOMECHANICALS) ×2 IMPLANT

## 2020-01-10 NOTE — ED Notes (Addendum)
Pt unable to provide a urine at this time

## 2020-01-10 NOTE — ED Provider Notes (Signed)
MSE was initiated and I personally evaluated the patient and placed orders (if any) at  8:39 PM on January 10, 2020.  The patient appears stable so that the remainder of the MSE may be completed by another provider.  Patient transferred from [facility] to be seen by general surgery for appendicitis.  Spoke with Dr. Magnus Ivan who will come and see patient   Lorre Nick, MD 01/10/20 2040

## 2020-01-10 NOTE — ED Provider Notes (Addendum)
[facility] EMERGENCY DEPARTMENT Provider Note   CSN: [id number] Arrival date & time: 01/10/20  1533     History Chief Complaint  Patient presents with  . Abdominal Pain    Jason [name] is a 34 y.o. male.  Patient with the onset of right lower quadrant discomfort at 2100 last evening.  It is progressively gotten worse.  Now has nausea and has had one episode of vomiting.  No prior abdominal surgeries.  Past medical history significant for hypertension.        Past Medical History:  Diagnosis Date  . HTN (hypertension) 10/07/2018  . Hypertension     Patient Active Problem List   Diagnosis Date Noted  . Hypercholesteremia 11/04/2018  . HTN (hypertension) 10/07/2018    Past Surgical History:  Procedure Laterality Date  . KNEE SURGERY         History reviewed. No pertinent family history.  Social History   Tobacco Use  . Smoking status: Current Every Day Smoker    Types: Cigars  . Smokeless tobacco: Never Used  Vaping Use  . Vaping Use: Never used  Substance Use Topics  . Alcohol use: Yes  . Drug use: Not on file    Home Medications Prior to Admission medications   Medication Sig Start Date End Date Taking? Authorizing Provider  amLODipine (NORVASC) 10 MG tablet Take 1 tablet (10 mg total) by mouth daily. 10/11/18 01/09/19  Toniann Fail, NP  atorvastatin (LIPITOR) 20 MG tablet Take 1 tablet (20 mg total) by mouth daily. 01/04/19 04/04/19  Toniann Fail, NP  Cyanocobalamin (VITAMIN B-12 PO) Take by mouth daily.    [provider]  HYDROcodone-acetaminophen (NORCO/VICODIN) 5-325 MG tablet Take 1 tablet by mouth every 4 (four) hours as needed for moderate pain. 09/22/19 09/21/20  Elson Areas, PA-C  lisinopril-hydrochlorothiazide (ZESTORETIC) 20-25 MG tablet Take 1 tablet by mouth daily.    [provider]    Allergies    Patient has no known allergies.  Review of Systems   Review of Systems  Constitutional:  Negative for chills and fever.  HENT: Negative for rhinorrhea and sore throat.   Eyes: Negative for visual disturbance.  Respiratory: Negative for cough and shortness of breath.   Cardiovascular: Negative for chest pain and leg swelling.  Gastrointestinal: Positive for abdominal pain, nausea and vomiting. Negative for diarrhea.  Genitourinary: Negative for dysuria.  Musculoskeletal: Negative for back pain and neck pain.  Skin: Negative for rash.  Neurological: Negative for dizziness, light-headedness and headaches.  Hematological: Does not bruise/bleed easily.  Psychiatric/Behavioral: Negative for confusion.    Physical Exam Updated Vital Signs BP 140/89   Pulse 88   Temp 98.6 F (37 C) (Oral)   Resp 18   Ht 1.829 m (6')   Wt 104.3 kg   SpO2 99%   BMI 31.19 kg/m   Physical Exam Vitals and nursing note reviewed.  Constitutional:      Appearance: Normal appearance. He is well-developed.  HENT:     Head: Normocephalic and atraumatic.  Eyes:     Extraocular Movements: Extraocular movements intact.     Conjunctiva/sclera: Conjunctivae normal.     Pupils: Pupils are equal, round, and reactive to light.  Cardiovascular:     Rate and Rhythm: Normal rate and regular rhythm.     Heart sounds: No murmur heard.   Pulmonary:     Effort: Pulmonary effort is normal. No respiratory distress.     Breath sounds: Normal breath  sounds.  Abdominal:     Palpations: Abdomen is soft.     Tenderness: There is abdominal tenderness. There is guarding.     Comments: Tenderness right lower quadrant with guarding.  Tenderness at McBurney's point.  Musculoskeletal:        General: Normal range of motion.     Cervical back: Normal range of motion and neck supple.  Skin:    General: Skin is warm and dry.  Neurological:     Mental Status: He is alert.     Sensory: Sensory deficit:      ED Results / Procedures / Treatments   Labs (all labs ordered are listed, but only abnormal results are  displayed) Labs Reviewed  COMPREHENSIVE METABOLIC PANEL - Abnormal; Notable for the following components:      Result Value   Glucose, Bld 110 (*)    Total Protein 8.8 (*)    All other components within normal limits  CBC - Abnormal; Notable for the following components:   RBC 5.88 (*)    All other components within normal limits  RESPIRATORY PANEL BY RT PCR (FLU A&B, COVID)  LIPASE, BLOOD  URINALYSIS, ROUTINE W REFLEX MICROSCOPIC    EKG None  Radiology CT Abdomen Pelvis W Contrast  Result Date: 01/10/2020 CLINICAL DATA:  Acute right lower quadrant abdominal pain. EXAM: CT ABDOMEN AND PELVIS WITH CONTRAST TECHNIQUE: Multidetector CT imaging of the abdomen and pelvis was performed using the standard protocol following bolus administration of intravenous contrast. CONTRAST:  OMNIPAQUE IOHEXOL 300 MG/ML  SOLN COMPARISON:  None. FINDINGS: Lower chest: No acute abnormality. Hepatobiliary: No focal liver abnormality is seen. No gallstones, gallbladder wall thickening, or biliary dilatation. Pancreas: Unremarkable. No pancreatic ductal dilatation or surrounding inflammatory changes. Spleen: Normal in size without focal abnormality. Adrenals/Urinary Tract: Adrenal glands are unremarkable. Kidneys are normal, without renal calculi, focal lesion, or hydronephrosis. Bladder is unremarkable. Stomach/Bowel: The stomach appears normal. There is no evidence of bowel obstruction. Acute appendicitis is noted without abscess formation. Appendix: Location: Right lower quadrant. Diameter: 13 mm. Appendicolith: Yes. Mucosal hyper-enhancement: Yes. Extraluminal gas: No. Periappendiceal collection: No. Vascular/Lymphatic: No significant vascular findings are present. No enlarged abdominal or pelvic lymph nodes. Reproductive: Prostate is unremarkable. Other: No abdominal wall hernia or abnormality. No abdominopelvic ascites. Musculoskeletal: No acute or significant osseous findings. IMPRESSION: Findings  consistent with acute appendicitis. No abscess formation is noted. Electronically Signed   By: Lupita Raider M.D.   On: 01/10/2020 17:44    Procedures Procedures (including critical care time)  Medications Ordered in ED Medications  0.9 %  sodium chloride infusion (has no administration in time range)  sodium chloride 0.9 % bolus 1,000 mL (1,000 mLs Intravenous New Bag/Given 01/10/20 1714)  ondansetron (ZOFRAN) injection 4 mg (4 mg Intravenous Given 01/10/20 1715)  HYDROmorphone (DILAUDID) injection 1 mg (1 mg Intravenous Given 01/10/20 1716)  iohexol (OMNIPAQUE) 300 MG/ML solution 100 mL (100 mLs Intravenous Contrast Given 01/10/20 1723)    ED Course  I have reviewed the triage vital signs and the nursing notes.  Pertinent labs & imaging results that were available during my care of the patient were reviewed by me and considered in my medical decision making (see chart for details).    MDM Rules/Calculators/A&P                          Patient with right lower quadrant abdominal pain onset at 2100 last evening.  Also has  guarding.  Presentation highly suspicious for acute appendicitis.  Patient will receive IV fluids labs and CT scan of the abdomen along with pain medication and antinausea medicine.  On CT scan confirms acute appendicitis without any complicating factors.  Will contact general surgery.  Patient will be started on Zosyn.  Covid testing pending    Final Clinical Impression(s) / ED Diagnoses Final diagnoses:  Right lower quadrant abdominal pain  Acute appendicitis with localized peritonitis, without perforation, abscess, or gangrene    Rx / DC Orders ED Discharge Orders    None       Vanetta Mulders, MD 01/10/20 1754    Vanetta Mulders, MD 01/10/20 1754  Patient will be transferred to [facility] emergency department.  For general surgery Dr. Rayburn Ma is on-call.  Discussed with Dr. Bruce Donath who is excepting for the emergency department  transfer.      Vanetta Mulders, MD 01/10/20 [phone]

## 2020-01-10 NOTE — H&P (Signed)
CC: Right lower quadrant abdominal pain    HPI: Jason [name] is an 34 y.o. male who is here for right lower quadrant abdominal pain.  He started having abdominal pain in the right lower quadrant last evening.  It came on gradually became acutely worse.  It is now sharp, constant, and severe.  He has had nausea and vomiting.  He denies fevers or chills.  Bowel movements are normal.  The pain does not refer anywhere else.  He presented to med [facility] [place] [facility] where he underwent a CT scan showing a dilated appendix with an appendicolith and periappendiceal inflammation consistent with acute appendicitis.  He was transferred here for further care.  He has no previous history of abdominal surgery.  He is otherwise without complaints.  Past Medical History:  Diagnosis Date   HTN (hypertension) 10/07/2018   Hypertension     Past Surgical History:  Procedure Laterality Date   KNEE SURGERY      History reviewed. No pertinent family history.  Social:  reports that he has been smoking cigars. He has never used smokeless tobacco. He reports current alcohol use. No history on file for drug use.  Allergies: No Known Allergies  Medications: I have reviewed the patient's current medications.  Results for orders placed or performed during the hospital encounter of 01/10/20 (from the past 48 hour(s))  Lipase, blood     Status: None   Collection Time: 01/10/20  3:43 PM  Result Value Ref Range   Lipase 32 11 - 51 U/L    Comment: Performed at [facility] [place], [address] [place], [place] [address]  Comprehensive metabolic panel     Status: Abnormal   Collection Time: 01/10/20  3:43 PM  Result Value Ref Range   Sodium 136 135 - 145 mmol/L   Potassium 3.6 3.5 - 5.1 mmol/L   Chloride 101 98 - 111 mmol/L   CO2 23 22 - 32 mmol/L   Glucose, Bld 110 (H) 70 - 99 mg/dL    Comment: Glucose reference range applies only to samples taken after fasting for at least 8 hours.   BUN 10 6 - 20  mg/dL   Creatinine, Ser 4.091.11 0.61 - 1.24 mg/dL   Calcium 9.9 8.9 - 81.110.3 mg/dL   Total Protein 8.8 (H) 6.5 - 8.1 g/dL   Albumin 4.7 3.5 - 5.0 g/dL   AST 36 15 - 41 U/L   ALT 43 0 - 44 U/L   Alkaline Phosphatase 72 38 - 126 U/L   Total Bilirubin 0.7 0.3 - 1.2 mg/dL   GFR, Estimated >91>60 >47>60 mL/min    Comment: (NOTE) Calculated using the CKD-EPI Creatinine Equation (2021)    Anion gap 12 5 - 15    Comment: Performed at [facility] [place], [address] [place], [place] [address]  CBC     Status: Abnormal   Collection Time: 01/10/20  3:43 PM  Result Value Ref Range   WBC 8.5 4.0 - 10.5 K/uL   RBC 5.88 (H) 4.22 - 5.81 MIL/uL   Hemoglobin 16.1 13.0 - 17.0 g/dL   HCT 21.349.7 39 - 52 %   MCV 84.5 80.0 - 100.0 fL   MCH 27.4 26.0 - 34.0 pg   MCHC 32.4 30.0 - 36.0 g/dL   RDW 08.612.7 57.811.5 - 46.915.5 %   Platelets 306 150 - 400 K/uL   nRBC 0.0 0.0 - 0.2 %    Comment: Performed at [facility]  [address] [place], [place] [address]  Respiratory Panel by RT PCR (Flu A&B, Covid) - Nasopharyngeal Swab     Status: None   Collection Time: 01/10/20  5:20 PM   Specimen: Nasopharyngeal Swab  Result Value Ref Range   SARS Coronavirus 2 by RT PCR NEGATIVE NEGATIVE    Comment: (NOTE) SARS-CoV-2 target nucleic acids are NOT DETECTED.  The SARS-CoV-2 RNA is generally detectable in upper respiratoy specimens during the acute phase of infection. The lowest concentration of SARS-CoV-2 viral copies this assay can detect is 131 copies/mL. A negative result does not preclude SARS-Cov-2 infection and should not be used as the sole basis for treatment or other patient management decisions. A negative result may occur with  improper specimen collection/handling, submission of specimen other than nasopharyngeal swab, presence of viral mutation(s) within the areas targeted by this assay, and inadequate number of viral copies (<131 copies/mL). A negative result must be combined with  clinical observations, patient history, and epidemiological information. The expected result is Negative.  Fact Sheet for Patients:  https://www.moore.com/  Fact Sheet for Healthcare Providers:  https://www.young.biz/  This test is no t yet approved or cleared by the [place] FDA and  has been authorized for detection and/or diagnosis of SARS-CoV-2 by FDA under an Emergency Use Authorization (EUA). This EUA will remain  in effect (meaning this test can be used) for the duration of the COVID-19 declaration under Section 564(b)(1) of the Act, 21 U.S.C. section 360bbb-3(b)(1), unless the authorization is terminated or revoked sooner.     Influenza A by PCR NEGATIVE NEGATIVE   Influenza B by PCR NEGATIVE NEGATIVE    Comment: (NOTE) The Xpert Xpress SARS-CoV-2/FLU/RSV assay is intended as an aid in  the diagnosis of influenza from Nasopharyngeal swab specimens and  should not be used as a sole basis for treatment. Nasal washings and  aspirates are unacceptable for Xpert Xpress SARS-CoV-2/FLU/RSV  testing.  Fact Sheet for Patients: https://www.moore.com/  Fact Sheet for Healthcare Providers: https://www.young.biz/  This test is not yet approved or cleared by the [place] FDA and  has been authorized for detection and/or diagnosis of SARS-CoV-2 by  FDA under an Emergency Use Authorization (EUA). This EUA will remain  in effect (meaning this test can be used) for the duration of the  Covid-19 declaration under Section 564(b)(1) of the Act, 21  U.S.C. section 360bbb-3(b)(1), unless the authorization is  terminated or revoked. Performed at [facility], [address] [place], [place] [address]     CT Abdomen Pelvis W Contrast  Result Date: 01/10/2020 CLINICAL DATA:  Acute right lower quadrant abdominal pain. EXAM: CT ABDOMEN AND PELVIS WITH CONTRAST TECHNIQUE: Multidetector CT  imaging of the abdomen and pelvis was performed using the standard protocol following bolus administration of intravenous contrast. CONTRAST:  OMNIPAQUE IOHEXOL 300 MG/ML  SOLN COMPARISON:  None. FINDINGS: Lower chest: No acute abnormality. Hepatobiliary: No focal liver abnormality is seen. No gallstones, gallbladder wall thickening, or biliary dilatation. Pancreas: Unremarkable. No pancreatic ductal dilatation or surrounding inflammatory changes. Spleen: Normal in size without focal abnormality. Adrenals/Urinary Tract: Adrenal glands are unremarkable. Kidneys are normal, without renal calculi, focal lesion, or hydronephrosis. Bladder is unremarkable. Stomach/Bowel: The stomach appears normal. There is no evidence of bowel obstruction. Acute appendicitis is noted without abscess formation. Appendix: Location: Right lower quadrant. Diameter: 13 mm. Appendicolith: Yes. Mucosal hyper-enhancement: Yes. Extraluminal gas: No. Periappendiceal collection: No. Vascular/Lymphatic: No significant vascular findings are present. No  enlarged abdominal or pelvic lymph nodes. Reproductive: Prostate is unremarkable. Other: No abdominal wall hernia or abnormality. No abdominopelvic ascites. Musculoskeletal: No acute or significant osseous findings. IMPRESSION: Findings consistent with acute appendicitis. No abscess formation is noted. Electronically Signed   By: Lupita Raider M.D.   On: 01/10/2020 17:44    ROS - all of the below systems have been reviewed with the patient and positives are indicated with bold text General: chills, fever or night sweats Eyes: blurry vision or double vision ENT: epistaxis or sore throat Allergy/Immunology: itchy/watery eyes or nasal congestion Hematologic/Lymphatic: bleeding problems, blood clots or swollen lymph nodes Endocrine: temperature intolerance or unexpected weight changes Breast: new or changing breast lumps or nipple discharge Resp: cough, shortness of breath, or  wheezing CV: chest pain or dyspnea on exertion GI: as per HPI GU: dysuria, trouble voiding, or hematuria MSK: joint pain or joint stiffness Neuro: TIA or stroke symptoms Derm: pruritus and skin lesion changes Psych: anxiety and depression  PE Blood pressure 129/83, pulse 90, temperature 98.5 F (36.9 C), resp. rate 18, height 6' (1.829 m), weight 104.3 kg, SpO2 99 %. Constitutional: NAD; conversant; no deformities Eyes: Moist conjunctiva; no lid lag; anicteric; PERRL Neck: Trachea midline; no thyromegaly Lungs: Normal respiratory effort; no tactile fremitus CV: RRR; no palpable thrills; no pitting edema GI: Abd soft with tenderness and guarding in the right lower quadrant; no palpable hepatosplenomegaly MSK: Normal range of motion of extremities; no clubbing/cyanosis Psychiatric: Appropriate affect; alert and oriented x3 Lymphatic: No palpable cervical or axillary lymphadenopathy  Results for orders placed or performed during the hospital encounter of 01/10/20 (from the past 48 hour(s))  Lipase, blood     Status: None   Collection Time: 01/10/20  3:43 PM  Result Value Ref Range   Lipase 32 11 - 51 U/L    Comment: Performed at [facility], [address] [place], [place] [address]  Comprehensive metabolic panel     Status: Abnormal   Collection Time: 01/10/20  3:43 PM  Result Value Ref Range   Sodium 136 135 - 145 mmol/L   Potassium 3.6 3.5 - 5.1 mmol/L   Chloride 101 98 - 111 mmol/L   CO2 23 22 - 32 mmol/L   Glucose, Bld 110 (H) 70 - 99 mg/dL    Comment: Glucose reference range applies only to samples taken after fasting for at least 8 hours.   BUN 10 6 - 20 mg/dL   Creatinine, Ser 2.29 0.61 - 1.24 mg/dL   Calcium 9.9 8.9 - 79.8 mg/dL   Total Protein 8.8 (H) 6.5 - 8.1 g/dL   Albumin 4.7 3.5 - 5.0 g/dL   AST 36 15 - 41 U/L   ALT 43 0 - 44 U/L   Alkaline Phosphatase 72 38 - 126 U/L   Total Bilirubin 0.7 0.3 - 1.2 mg/dL   GFR, Estimated >92 >11 mL/min     Comment: (NOTE) Calculated using the CKD-EPI Creatinine Equation (2021)    Anion gap 12 5 - 15    Comment: Performed at [facility], [address] [place], [place] [address]  CBC     Status: Abnormal   Collection Time: 01/10/20  3:43 PM  Result Value Ref Range   WBC 8.5 4.0 - 10.5 K/uL   RBC 5.88 (H) 4.22 - 5.81 MIL/uL   Hemoglobin 16.1 13.0 - 17.0 g/dL   HCT 08.1 39 - 52 %   MCV 84.5 80.0 -  100.0 fL   MCH 27.4 26.0 - 34.0 pg   MCHC 32.4 30.0 - 36.0 g/dL   RDW 16.1 09.6 - 04.5 %   Platelets 306 150 - 400 K/uL   nRBC 0.0 0.0 - 0.2 %    Comment: Performed at [facility], [address] [place], [place] [address]  Respiratory Panel by RT PCR (Flu A&B, Covid) - Nasopharyngeal Swab     Status: None   Collection Time: 01/10/20  5:20 PM   Specimen: Nasopharyngeal Swab  Result Value Ref Range   SARS Coronavirus 2 by RT PCR NEGATIVE NEGATIVE    Comment: (NOTE) SARS-CoV-2 target nucleic acids are NOT DETECTED.  The SARS-CoV-2 RNA is generally detectable in upper respiratoy specimens during the acute phase of infection. The lowest concentration of SARS-CoV-2 viral copies this assay can detect is 131 copies/mL. A negative result does not preclude SARS-Cov-2 infection and should not be used as the sole basis for treatment or other patient management decisions. A negative result may occur with  improper specimen collection/handling, submission of specimen other than nasopharyngeal swab, presence of viral mutation(s) within the areas targeted by this assay, and inadequate number of viral copies (<131 copies/mL). A negative result must be combined with clinical observations, patient history, and epidemiological information. The expected result is Negative.  Fact Sheet for Patients:  https://www.moore.com/  Fact Sheet for Healthcare Providers:  https://www.young.biz/  This test is no t yet approved or cleared by the [place] FDA and  has been authorized for detection and/or diagnosis of SARS-CoV-2 by FDA under an Emergency Use Authorization (EUA). This EUA will remain  in effect (meaning this test can be used) for the duration of the COVID-19 declaration under Section 564(b)(1) of the Act, 21 U.S.C. section 360bbb-3(b)(1), unless the authorization is terminated or revoked sooner.     Influenza A by PCR NEGATIVE NEGATIVE   Influenza B by PCR NEGATIVE NEGATIVE    Comment: (NOTE) The Xpert Xpress SARS-CoV-2/FLU/RSV assay is intended as an aid in  the diagnosis of influenza from Nasopharyngeal swab specimens and  should not be used as a sole basis for treatment. Nasal washings and  aspirates are unacceptable for Xpert Xpress SARS-CoV-2/FLU/RSV  testing.  Fact Sheet for Patients: https://www.moore.com/  Fact Sheet for Healthcare Providers: https://www.young.biz/  This test is not yet approved or cleared by the [place] FDA and  has been authorized for detection and/or diagnosis of SARS-CoV-2 by  FDA under an Emergency Use Authorization (EUA). This EUA will remain  in effect (meaning this test can be used) for the duration of the  Covid-19 declaration under Section 564(b)(1) of the Act, 21  U.S.C. section 360bbb-3(b)(1), unless the authorization is  terminated or revoked. Performed at [facility], [address], [place], [place] [address]     CT Abdomen Pelvis W Contrast  Result Date: 01/10/2020 CLINICAL DATA:  Acute right lower quadrant abdominal pain. EXAM: CT ABDOMEN AND PELVIS WITH CONTRAST TECHNIQUE: Multidetector CT imaging of the abdomen and pelvis was performed using the standard protocol following bolus administration of intravenous contrast. CONTRAST:  OMNIPAQUE IOHEXOL 300 MG/ML  SOLN COMPARISON:  None. FINDINGS: Lower chest: No acute abnormality. Hepatobiliary: No focal liver abnormality is seen. No gallstones, gallbladder  wall thickening, or biliary dilatation. Pancreas: Unremarkable. No pancreatic ductal dilatation or surrounding inflammatory changes. Spleen: Normal in size without focal abnormality. Adrenals/Urinary Tract: Adrenal glands are unremarkable. Kidneys are normal, without renal calculi, focal lesion,  or hydronephrosis. Bladder is unremarkable. Stomach/Bowel: The stomach appears normal. There is no evidence of bowel obstruction. Acute appendicitis is noted without abscess formation. Appendix: Location: Right lower quadrant. Diameter: 13 mm. Appendicolith: Yes. Mucosal hyper-enhancement: Yes. Extraluminal gas: No. Periappendiceal collection: No. Vascular/Lymphatic: No significant vascular findings are present. No enlarged abdominal or pelvic lymph nodes. Reproductive: Prostate is unremarkable. Other: No abdominal wall hernia or abnormality. No abdominopelvic ascites. Musculoskeletal: No acute or significant osseous findings. IMPRESSION: Findings consistent with acute appendicitis. No abscess formation is noted. Electronically Signed   By: Lupita Raider M.D.   On: 01/10/2020 17:44     A/P: Acute appendicitis  I discussed the diagnosis with the patient in detail.  I reviewed his CT scan and laboratory data.  Given the inflammation and appendicolith, laparoscopic appendectomy has been recommended.  I discussed the reasons for this with him in detail.  I discussed the surgical procedure in detail.  I discussed the risk which includes but is not limited to bleeding, infection, injury to surrounding structures, appendiceal stump leak, the need for further procedures, cardiopulmonary issues, postoperative recovery, etc.  He understands and agrees to proceed with surgery which is scheduled urgently.  Abigail Miyamoto, M.D. [facility] Use [email or web address] to contact on call provider

## 2020-01-10 NOTE — ED Notes (Signed)
Pt requesting more pain medication. Dr. Mellody Life aware, no new orders.

## 2020-01-10 NOTE — Op Note (Signed)
Appendectomy, Lap, Procedure Note  Indications: The patient presented with a history of right-sided abdominal pain. A CT revealed findings consistent with acute appendicitis.  Pre-operative Diagnosis: acute appendicitis  Post-operative Diagnosis: Same  Surgeon: Abigail Miyamoto   Assistants: 0  Anesthesia: General endotracheal anesthesia  ASA Class: 2  Procedure Details  The patient was seen again in the Holding Room. The risks, benefits, complications, treatment options, and expected outcomes were discussed with the patient and/or family. The possibilities of reaction to medication, perforation of viscus, bleeding, recurrent infection, finding a normal appendix, the need for additional procedures, failure to diagnose a condition, and creating a complication requiring transfusion or operation were discussed. There was concurrence with the proposed plan and informed consent was obtained. The site of surgery was properly noted. The patient was taken to Operating Room, identified as Jason [name] and the procedure verified as Appendectomy. A Time Out was held and the above information confirmed.  The patient was placed in the supine position and general anesthesia was induced, along with placement of orogastric tube, Venodyne boots, and a Foley catheter. The abdomen was prepped and draped in a sterile fashion. A one centimeter infraumbilical incision was made.  The  fascia was incised with a #15 blade.  A Kelly clamp was used to confirm entrance into the peritoneal cavity.  A pursestring suture was passed around the incision with a 0 Vicryl.  The Hasson was introduced into the abdomen and the tails of the suture were used to hold the Hasson in place.   The pneumoperitoneum was then established to steady pressure of 15 mmHg.  Additional 5 mm cannulas then placed in the left lower quadrant of the abdomen and the right upper quadrant region under direct visualization. A careful evaluation of the  entire abdomen was carried out. The patient was placed in Trendelenburg and left lateral decubitus position. The small intestines were retracted in the cephalad and left lateral direction away from the pelvis and right lower quadrant. The patient was found to have an enlarged and inflamed appendix that was extending into the pelvis. There was no evidence of perforation.  The appendix was carefully dissected. The appendix was was skeletonized with the harmonic scalpel.   The appendix was divided at its base using an endo-GIA stapler. Minimal appendiceal stump was left in place. There was no evidence of bleeding, leakage, or complication after division of the appendix. Irrigation was also performed and irrigate suctioned from the abdomen as well.  The umbilical port site was closed with the purse string suture. There was no residual palpable fascial defect.  The trocar site skin wounds were closed with 4-0 Monocryl.  Instrument, sponge, and needle counts were correct at the conclusion of the case.   Findings: The appendix was found to be inflamed. There were not signs of necrosis.  There was not perforation. There was not abscess formation.  Estimated Blood Loss:  Minimal         Drains:none         Complications:  None; patient tolerated the procedure well.         Disposition: PACU - hemodynamically stable.         Condition: stable

## 2020-01-10 NOTE — ED Notes (Signed)
Patient transported to CT 

## 2020-01-10 NOTE — ED Triage Notes (Signed)
RLQ pain x 1 day. Reports N/V. Denies diarrhea. Denies fever

## 2020-01-10 NOTE — Anesthesia Preprocedure Evaluation (Signed)
Anesthesia Evaluation  Patient identified by MRN, date of birth, ID band Patient awake    Reviewed: Allergy & Precautions, NPO status , Patient's Chart, lab work & pertinent test results  Airway Mallampati: II  TM Distance: >3 FB Neck ROM: Full    Dental  (+) Dental Advisory Given   Pulmonary Current Smoker,    breath sounds clear to auscultation       Cardiovascular hypertension, Pt. on medications  Rhythm:Regular Rate:Normal     Neuro/Psych negative neurological ROS     GI/Hepatic Neg liver ROS, Acute appendicitis    Endo/Other  negative endocrine ROS  Renal/GU negative Renal ROS     Musculoskeletal   Abdominal   Peds  Hematology negative hematology ROS (+)   Anesthesia Other Findings   Reproductive/Obstetrics                             Anesthesia Physical Anesthesia Plan  ASA: II and emergent  Anesthesia Plan: General   Post-op Pain Management:    Induction: Intravenous and Rapid sequence  PONV Risk Score and Plan: 1 and Ondansetron, Treatment may vary due to age or medical condition and Dexamethasone  Airway Management Planned: Oral ETT  Additional Equipment: None  Intra-op Plan:   Post-operative Plan: Extubation in OR  Informed Consent: I have reviewed the patients History and Physical, chart, labs and discussed the procedure including the risks, benefits and alternatives for the proposed anesthesia with the patient or authorized representative who has indicated his/her understanding and acceptance.     Dental advisory given  Plan Discussed with: CRNA and Surgeon  Anesthesia Plan Comments:         Anesthesia Quick Evaluation

## 2020-01-10 NOTE — Anesthesia Procedure Notes (Signed)
Procedure Name: Intubation Date/Time: 01/10/2020 9:36 PM Performed by: Elyn Peers, CRNA Pre-anesthesia Checklist: Patient identified, Emergency Drugs available, Suction available, Patient being monitored and Timeout performed Patient Re-evaluated:Patient Re-evaluated prior to induction Oxygen Delivery Method: Circle system utilized Preoxygenation: Pre-oxygenation with 100% oxygen Induction Type: IV induction, Rapid sequence and Cricoid Pressure applied Laryngoscope Size: Miller and 3 Grade View: Grade I Tube type: Oral Tube size: 7.5 mm Number of attempts: 1 Airway Equipment and Method: Stylet Placement Confirmation: ETT inserted through vocal cords under direct vision,  positive ETCO2 and breath sounds checked- equal and bilateral Secured at: 23 cm Tube secured with: Tape Dental Injury: Teeth and Oropharynx as per pre-operative assessment

## 2020-01-10 NOTE — ED Notes (Signed)
Pt AO x 4 vss, pt is c/o abd. pain

## 2020-01-10 NOTE — ED Notes (Signed)
Pt states provided urine specimen while in waiting room, unable to locate specimen in lab, pt aware of need to provide urine specimen when able.  IV fluids infusing

## 2020-01-10 NOTE — ED Notes (Signed)
Pt for ED to ED transfer, accepting physician Dr Freida Busman.

## 2020-01-10 NOTE — Anesthesia Postprocedure Evaluation (Signed)
Anesthesia Post Note  Patient: Jason [name]  Procedure(s) Performed: APPENDECTOMY LAPAROSCOPIC (N/A Abdomen)     Patient location during evaluation: PACU Anesthesia Type: General Level of consciousness: awake and alert Pain management: pain level controlled Vital Signs Assessment: post-procedure vital signs reviewed and stable Respiratory status: spontaneous breathing, nonlabored ventilation, respiratory function stable and patient connected to nasal cannula oxygen Cardiovascular status: blood pressure returned to baseline and stable Postop Assessment: no apparent nausea or vomiting Anesthetic complications: no   No complications documented.  Last Vitals:  Vitals:   01/10/20 2230 01/10/20 2245  BP: (!) 142/95 140/89  Pulse: 88 89  Resp: (!) 21 20  Temp:    SpO2: 100% 97%    Last Pain:  Vitals:   01/10/20 2230  TempSrc:   PainSc: Asleep                 Kennieth Rad

## 2020-01-10 NOTE — ED Notes (Signed)
Spoke with Ruby at [facility] for transport to [facility] ED, accepting provider Bruce Donath, MD for general surgery.

## 2020-01-10 NOTE — ED Notes (Addendum)
Spoke with Gala Romney at [place], will call [place], General surgery consult. [phone]. Also sent secure chat to on call Magnus Ivan, MD to return call to EDP [facility]

## 2020-01-10 NOTE — Transfer of Care (Signed)
Immediate Anesthesia Transfer of Care Note  Patient: Jason [name]  Procedure(s) Performed: APPENDECTOMY LAPAROSCOPIC (N/A Abdomen)  Patient Location: PACU  Anesthesia Type:General  Level of Consciousness: awake, drowsy and responds to stimulation  Airway & Oxygen Therapy: Patient Spontanous Breathing and Patient connected to face mask oxygen  Post-op Assessment: Report given to RN and Post -op Vital signs reviewed and stable  Post vital signs: Reviewed and stable  Last Vitals:  Vitals Value Taken Time  BP    Temp    Pulse 92 01/10/20 2220  Resp 16 01/10/20 2220  SpO2 98 % 01/10/20 2220  Vitals shown include unvalidated device data.  Last Pain:  Vitals:   01/10/20 2029  TempSrc:   PainSc: 10-Worst pain ever         Complications: No complications documented.

## 2020-01-11 ENCOUNTER — Encounter (HOSPITAL_COMMUNITY): Payer: Self-pay | Admitting: Surgery

## 2020-01-11 MED ORDER — ENOXAPARIN SODIUM 40 MG/0.4ML ~~LOC~~ SOLN: 40.0000 mg | SUBCUTANEOUS | Status: DC

## 2020-01-11 MED ORDER — ACETAMINOPHEN 500 MG PO TABS
1000.0000 mg | ORAL_TABLET | Freq: Four times a day (QID) | ORAL | 0 refills | Status: DC
Start: 2020-01-11 — End: 2020-07-27

## 2020-01-11 MED ORDER — TRAMADOL HCL 50 MG PO TABS: 50.0000 mg | ORAL_TABLET | Freq: Four times a day (QID) | ORAL | Status: DC | PRN

## 2020-01-11 MED ORDER — METHOCARBAMOL 500 MG PO TABS: 500.0000 mg | ORAL_TABLET | Freq: Four times a day (QID) | ORAL | Status: DC | PRN

## 2020-01-11 MED ORDER — AMLODIPINE BESYLATE 10 MG PO TABS: 10.0000 mg | ORAL_TABLET | Freq: Every day | ORAL | 1 refills | Status: DC

## 2020-01-11 MED ORDER — DIPHENHYDRAMINE HCL 25 MG PO CAPS: 25.0000 mg | ORAL_CAPSULE | Freq: Four times a day (QID) | ORAL | Status: DC | PRN

## 2020-01-11 MED ORDER — OXYCODONE HCL 5 MG PO TABS
5.0000 mg | ORAL_TABLET | Freq: Four times a day (QID) | ORAL | 0 refills | Status: DC | PRN
Start: 2020-01-11 — End: 2020-06-27

## 2020-01-11 MED ORDER — DIPHENHYDRAMINE HCL 50 MG/ML IJ SOLN: 25.0000 mg | Freq: Four times a day (QID) | INTRAMUSCULAR | Status: DC | PRN

## 2020-01-11 MED ORDER — GABAPENTIN 300 MG PO CAPS
300.0000 mg | ORAL_CAPSULE | Freq: Two times a day (BID) | ORAL | Status: DC
  Filled 2020-01-11: qty 1

## 2020-01-11 MED ORDER — ONDANSETRON 4 MG PO TBDP
4.0000 mg | ORAL_TABLET | Freq: Four times a day (QID) | ORAL | 0 refills | Status: DC | PRN
Start: 2020-01-11 — End: 2020-07-24

## 2020-01-11 MED ORDER — MORPHINE SULFATE (PF) 2 MG/ML IV SOLN
1.0000 mg | INTRAVENOUS | Status: DC | PRN
  Administered 2020-01-11: 2 mg via INTRAVENOUS
  Filled 2020-01-11: qty 1

## 2020-01-11 MED ORDER — ONDANSETRON 4 MG PO TBDP: 4.0000 mg | ORAL_TABLET | Freq: Four times a day (QID) | ORAL | Status: DC | PRN

## 2020-01-11 MED ORDER — ONDANSETRON HCL 4 MG/2ML IJ SOLN
4.0000 mg | Freq: Four times a day (QID) | INTRAMUSCULAR | Status: DC | PRN
  Administered 2020-01-11: 4 mg via INTRAVENOUS
  Filled 2020-01-11: qty 2

## 2020-01-11 MED ORDER — OXYCODONE HCL 5 MG PO TABS
5.0000 mg | ORAL_TABLET | ORAL | Status: DC | PRN
  Administered 2020-01-11: 10 mg via ORAL
  Filled 2020-01-11: qty 2

## 2020-01-11 MED ORDER — ACETAMINOPHEN 500 MG PO TABS
1000.0000 mg | ORAL_TABLET | Freq: Four times a day (QID) | ORAL | Status: DC
  Administered 2020-01-11: 1000 mg via ORAL
  Filled 2020-01-11: qty 2

## 2020-01-11 MED ORDER — AMLODIPINE BESYLATE 10 MG PO TABS
10.0000 mg | ORAL_TABLET | Freq: Every day | ORAL | Status: DC
  Administered 2020-01-11: 10 mg via ORAL
  Filled 2020-01-11: qty 1

## 2020-01-11 MED ORDER — CELECOXIB 200 MG PO CAPS
200.0000 mg | ORAL_CAPSULE | Freq: Two times a day (BID) | ORAL | Status: DC
  Filled 2020-01-11: qty 1

## 2020-01-11 NOTE — Discharge Instructions (Signed)
[facility]  Please arrive at least 30 min before your appointment to complete your check in paperwork.  If you are unable to arrive 30 min prior to your appointment time we may have to cancel or reschedule you. LAPAROSCOPIC SURGERY: POST OP INSTRUCTIONS Always review your discharge instruction sheet given to you by the facility where your surgery was performed. IF YOU HAVE DISABILITY OR FAMILY LEAVE FORMS, YOU MUST BRING THEM TO THE OFFICE FOR PROCESSING.   DO NOT GIVE THEM TO YOUR DOCTOR.  PAIN CONTROL  1. First take acetaminophen (Tylenol) AND/or ibuprofen (Advil) to control your pain after surgery.  Follow directions on package.  Taking acetaminophen (Tylenol) and/or ibuprofen (Advil) regularly after surgery will help to control your pain and lower the amount of prescription pain medication you may need.  You should not take more than 4,000 mg (4 grams) of acetaminophen (Tylenol) in 24 hours.  You should not take ibuprofen (Advil), aleve, motrin, naprosyn or other NSAIDS if you have a history of stomach ulcers or chronic kidney disease.  2. A prescription for pain medication may be given to you upon discharge.  Take your pain medication as prescribed, if you still have uncontrolled pain after taking acetaminophen (Tylenol) or ibuprofen (Advil). 3. Use ice packs to help control pain. 4. If you need a refill on your pain medication, please contact your pharmacy.  They will contact our office to request authorization. Prescriptions will not be filled after 5pm or on week-ends.  HOME MEDICATIONS 5. Take your usually prescribed medications unless otherwise directed.  DIET 6. You should follow a light diet the first few days after arrival home.  Be sure to include lots of fluids daily. Avoid fatty, fried foods.   CONSTIPATION 7. It is common to experience some constipation after surgery and if you are taking pain medication.  Increasing fluid intake and taking a stool  softener (such as Colace) will usually help or prevent this problem from occurring.  A mild laxative (Milk of Magnesia or Miralax) should be taken according to package instructions if there are no bowel movements after 48 hours.  WOUND/INCISION CARE 8. Most patients will experience some swelling and bruising in the area of the incisions.  Ice packs will help.  Swelling and bruising can take several days to resolve.  9. Unless discharge instructions indicate otherwise, follow guidelines below  a. STERI-STRIPS - you may remove your outer bandages 48 hours after surgery, and you may shower at that time.  You have steri-strips (small skin tapes) in place directly over the incision.  These strips should be left on the skin for 7-10 days.   b. DERMABOND/SKIN GLUE - you may shower in 24 hours.  The glue will flake off over the next 2-3 weeks. 10. Any sutures or staples will be removed at the office during your follow-up visit.  ACTIVITIES 11. You may resume regular (light) daily activities beginning the next day--such as daily self-care, walking, climbing stairs--gradually increasing activities as tolerated.  You may have sexual intercourse when it is comfortable.  Refrain from any heavy lifting or straining until approved by your doctor. a. You may drive when you are no longer taking prescription pain medication, you can comfortably wear a seatbelt, and you can safely maneuver your car and apply brakes.  FOLLOW-UP 12. You should see your doctor in the office for a follow-up appointment approximately 2-3 weeks after your surgery.  You should have been given your post-op/follow-up appointment when   your surgery was scheduled.  If you did not receive a post-op/follow-up appointment, make sure that you call for this appointment within a day or two after you arrive home to insure a convenient appointment time.   WHEN TO CALL YOUR DOCTOR: 1. Fever over 101.0 2. Inability to urinate 3. Continued bleeding from  incision. 4. Increased pain, redness, or drainage from the incision. 5. Increasing abdominal pain  The clinic staff is available to answer your questions during regular business hours.  Please don't hesitate to call and ask to speak to one of the nurses for clinical concerns.  If you have a medical emergency, go to the nearest emergency room or call 911.  A surgeon from [facility] is always on call at the hospital. [address], [address], [place], [place]  [address] ? [address] [address], [place], [place]   [address] [phone] ? [phone] ? FAX [phone]  I spoke with your Orthopedic team. You are touch down weight bearing to your right lower extremity. Use crutches to assist with offloading weight as needed. They recommend a hinged knee brace, which we discussed while you were here and you stated you were just approved for as an outpatient. When this comes in, please wear the brace as instructed. Please follow up with Dr. Jena Gauss as advised during your last appointment with him.    Crutch Use, Adult Crutches are used to take weight off of one of your legs or feet when you stand or walk. You may need crutches to help you heal after an injury or procedure. It is important to use crutches that fit properly. When fitted properly:  Each crutch should be 2-3 finger widths below the armpit.  Your weight should be supported by your hand and not by resting your armpit on the crutch. It is important that a health care provider has seen you use crutches effectively before you use them at home. What are the risks? Improper use of crutches can injure your shoulders, arms, back, armpits, wrists, and hands. To prevent this from happening, make sure your crutches fit properly, and do not put pressure on your armpits when using the crutches. While using crutches, you also have a higher risk of falling. To prevent falls while using crutches at home or work:  Move furniture or barriers  that are in your walkway when possible. Have someone help you with this as needed.  Remove rugs, cords, and other items that you can trip on. Have someone help you with this as needed.  Keep walkways well lit.  Use a backpack so you do not need to carry items in your hands. How to use your crutches How you use your crutches will depend on the reason you need them. Your health care provider may tell you not to put any weight on the affected leg (non-weight-bearing). Or your health care provider may allow you to put some, but not all, weight on the affected leg (partial weight-bearing). Follow instructions from your health care provider about weight-bearing. Do not bear weight in an amount that causes pain to the affected area. Walking  1. Stand on your healthy leg and lift both crutches at the same time. 2. Place the crutches one step-length in front of you. Keep your weight over the hand grips. 3. Bring your healthy leg forward to meet, or land slightly ahead of, the crutches. 4. Repeat. Going up steps If there is no handrail: 1. Walk up to steps and put  weight on hand grips to step up. 2. Step up with the healthy leg. 3. Step up with the crutches and injured leg. 4. Repeat. If there is a handrail: 1. Hold both crutches in one hand. 2. Place your other hand on the handrail. 3. Place your weight on your arms and step up with your healthy leg. 4. Bring the crutches and the injured leg up to that step. 5. Continue in this way. If you feel unsteady on steps, you can go up steps on your buttocks. To go up, sit on the lowest step with your injured leg in front and holding both crutches flat against the stairs in one hand. Then use your free hand and your healthy leg for support to scoot your buttocks up to the next step. Going down steps If there is no handrail: 1. Step down with the injured leg and crutches. Make sure to keep the crutch tips in the center of the step, not close to the front  edge. 2. Step down with the healthy leg. 3. Repeat. If there is a handrail: 1. Place one hand on the handrail. 2. Hold both crutches with your free hand. 3. Lower your injured leg and crutches to the step below you. Make sure to keep the crutch tips in the center of the step, not close to the front edge. 4. Lower your healthy leg down to the next step. 5. Repeat. If you feel unsteady on steps, you can go down steps on your buttocks. To go down, sit on the highest step with your injured leg in front and holding both crutches flat against the stairs in the other hand. Then use your free hand and your healthy leg for support to scoot your buttocks down to the next step. Standing up  Move to the edge of the seat. If there is an armrest: 1. Hold the injured leg forward. 2. Grab the armrest with one hand and the top of the crutches with the other hand. 3. Using the armrest and your crutches, pull yourself up to a standing position. If there is no armrest: 1. Hold the injured leg forward. 2. Hold on to the seat with one hand and the top of the crutches with the other hand. 3. Using the seat and your crutches, bring yourself up to a standing position. Sitting down  Move back until your leg touches the edge of the seat. If there is an armrest: 1. Hold the injured leg forward. 2. Grab the armrest with one hand and the top of the crutches with the other hand. 3. Slowly lower yourself to a sitting position. If there is no armrest: 1. Hold the injured leg forward. 2. Reach for and hold on to the seat with one hand and hold on to the top of the crutches with the other hand. 3. Slowly lower yourself to a sitting position. Contact a health care provider if:  You feel unsteady using crutches.  You develop any new pain.  You develop any numbness or tingling.  Your crutches do not fit. Get help right away if:  You fall. Summary  Crutches are used when you need to take weight off of one of  your legs or feet to stand or walk.  To prevent injury, make sure your crutches fit properly, and do not put pressure on your armpits when using the crutches.  Follow instructions from your health care provider about weight-bearing. This information is not intended to replace advice given to you  by your health care provider. Make sure you discuss any questions you have with your health care provider. Document Revised: 09/15/2018 Document Reviewed: 09/15/2018 Elsevier Patient Education  2020 [employer].

## 2020-01-11 NOTE — Discharge Summary (Signed)
Patient ID: Jason [name] [id number] 1985-05-28 34 y.o.  Admit date: 01/10/2020 Discharge date: 01/11/2020  Admitting Diagnosis: Acute appendicitis   Discharge Diagnosis Patient Active Problem List   Diagnosis Date Noted  . Acute appendicitis 01/10/2020  . Hypercholesteremia 11/04/2018  . HTN (hypertension) 10/07/2018    Consultants None   Reason for Admission: Jason [name] is an 34 y.o. male who is here for right lower quadrant abdominal pain.  He started having abdominal pain in the right lower quadrant last evening.  It came on gradually became acutely worse.  It is now sharp, constant, and severe.  He has had nausea and vomiting.  He denies fevers or chills.  Bowel movements are normal.  The pain does not refer anywhere else.  He presented to med [facility] where he underwent a CT scan showing a dilated appendix with an appendicolith and periappendiceal inflammation consistent with acute appendicitis.  He was transferred here for further care.  He has no previous history of abdominal surgery.  He is otherwise without complaints.  Procedures Laparoscopic Appendectomy - Dr. Magnus Ivan - 01/10/2020  Hospital Course:  The patient was admitted and underwent a laparoscopic appendectomy.  The patient tolerated the procedure well.  On POD 1, the patient was tolerating a diet, voiding well, mobilizing (see below), and pain was controlled with oral pain medications.  The patient was stable for DC home at this time with appropriate follow up made.  Patient noted to be following for a right patella fracture as outpatient. I spoke with his Orthopedic team to clarify weight bearing restrictions to ensure when mobilizing here he adhered to them. They recommended TDWB of the RLE with crutches. Crutches were ordered here and patient tolerated ambulation. He reports he already has crutches at home that he can use. He reports he has a hinged knee brace that he was approved for as an  outpatient already. He plans to follow up with Dr. Jena Gauss for this.   Physical Exam: Gen:  Alert, NAD, pleasant Card:  RRR Pulm:  CTAB, no W/R/R, effort normal, on RA Abd: Soft, ND, appropriately tender around laparoscopic incisions, +BS, Incisions with glue intact appears well and are without drainage, bleeding, or signs of infection Ext:  No LE edema  Psych: A&Ox3  Skin: no rashes noted, warm and dry  Allergies as of 01/11/2020   No Known Allergies     Medication List    TAKE these medications   acetaminophen 500 MG tablet Commonly known as: TYLENOL Take 2 tablets (1,000 mg total) by mouth every 6 (six) hours.   amLODipine 10 MG tablet Commonly known as: NORVASC Take 1 tablet (10 mg total) by mouth daily.   atorvastatin 80 MG tablet Commonly known as: LIPITOR Take 80 mg by mouth daily.   lisinopril-hydrochlorothiazide 20-25 MG tablet Commonly known as: ZESTORETIC Take 1 tablet by mouth daily.   ondansetron 4 MG disintegrating tablet Commonly known as: ZOFRAN-ODT Take 1 tablet (4 mg total) by mouth every 6 (six) hours as needed for nausea.   oxyCODONE 5 MG immediate release tablet Commonly known as: Oxy IR/ROXICODONE Take 1 tablet (5 mg total) by mouth every 6 (six) hours as needed for breakthrough pain.         Follow-up Information    [facility], [facility]. Go on 01/31/2020.   Specialty: General Surgery Why: 10am. Please arrive 30 minutes prior to your appointment for paperwork. Please bring a copy of your photo ID and insurance card to the  appointment.  Contact information: [address] [place] [address] [phone]        Haddix, Gillie Manners, MD Follow up.   Specialty: Orthopedic Surgery Contact information: [address] [place] [address] [phone]               Signed: Leary Roca, [facility] 01/11/2020, 10:11 AM Please see Amion for pager number during day hours 7:00am-4:30pm

## 2020-01-11 NOTE — Progress Notes (Signed)
Orthopedic Tech Progress Note Patient Details:  Jason [name] 18-May-1985 [id number]  Patient ID: Jason [name], male   DOB: [birth year], 34 y.o.   MRN: [id number]   Jason [name] 01/11/2020, 9:53 AM Patient declined crutches. States he has at home.

## 2020-01-11 NOTE — Progress Notes (Signed)
Discharge instructions given to pt and all questions were answered.  

## 2020-01-12 LAB — SURGICAL PATHOLOGY

## 2020-04-01 ENCOUNTER — Encounter (HOSPITAL_COMMUNITY): Payer: Self-pay | Admitting: Surgery

## 2020-04-01 NOTE — OR Nursing (Signed)
Update to reflect change in case start time. First entry was inaccurate.Jason [name]

## 2020-06-16 ENCOUNTER — Ambulatory Visit: Payer: Self-pay | Admitting: Student

## 2020-06-16 DIAGNOSIS — S82001K Unspecified fracture of right patella, subsequent encounter for closed fracture with nonunion: Secondary | ICD-10-CM | POA: Insufficient documentation

## 2020-06-25 ENCOUNTER — Other Ambulatory Visit: Payer: Self-pay

## 2020-06-25 ENCOUNTER — Other Ambulatory Visit (HOSPITAL_COMMUNITY)
Admission: RE | Admit: 2020-06-25 | Discharge: 2020-06-25 | Disposition: A | Discharge status: Home / Self Care | Payer: Worker's Compensation | Source: Ambulatory Visit | Attending: Student | Admitting: Student

## 2020-06-25 ENCOUNTER — Encounter (HOSPITAL_COMMUNITY): Payer: Self-pay | Admitting: Student

## 2020-06-25 DIAGNOSIS — Z01812 Encounter for preprocedural laboratory examination: Secondary | ICD-10-CM | POA: Insufficient documentation

## 2020-06-25 DIAGNOSIS — Z6831 Body mass index (BMI) 31.0-31.9, adult: Secondary | ICD-10-CM | POA: Diagnosis not present

## 2020-06-25 DIAGNOSIS — F17290 Nicotine dependence, other tobacco product, uncomplicated: Secondary | ICD-10-CM | POA: Diagnosis not present

## 2020-06-25 DIAGNOSIS — W11XXXA Fall on and from ladder, initial encounter: Secondary | ICD-10-CM | POA: Diagnosis not present

## 2020-06-25 DIAGNOSIS — S82091A Other fracture of right patella, initial encounter for closed fracture: Secondary | ICD-10-CM | POA: Diagnosis present

## 2020-06-25 DIAGNOSIS — E669 Obesity, unspecified: Secondary | ICD-10-CM | POA: Diagnosis not present

## 2020-06-25 DIAGNOSIS — Z79899 Other long term (current) drug therapy: Secondary | ICD-10-CM | POA: Diagnosis not present

## 2020-06-25 DIAGNOSIS — Z20822 Contact with and (suspected) exposure to covid-19: Secondary | ICD-10-CM | POA: Insufficient documentation

## 2020-06-25 LAB — SARS CORONAVIRUS 2 (TAT 6-24 HRS): SARS Coronavirus 2: NEGATIVE

## 2020-06-25 NOTE — Progress Notes (Signed)
Jason [name] denies chest pain or shortness of breath. Patient was tested for Covid and has been in quarantine since that time.

## 2020-06-26 NOTE — Anesthesia Preprocedure Evaluation (Addendum)
Anesthesia Evaluation  Patient identified by MRN, date of birth, ID band Patient awake    Reviewed: Allergy & Precautions, NPO status , Patient's Chart, lab work & pertinent test results  Airway Mallampati: II  TM Distance: >3 FB Neck ROM: Full    Dental  (+) Teeth Intact, Dental Advisory Given   Pulmonary neg pulmonary ROS, Current Smoker and Patient abstained from smoking.,    Pulmonary exam normal breath sounds clear to auscultation       Cardiovascular hypertension, Pt. on medications Normal cardiovascular exam Rhythm:Regular Rate:Normal     Neuro/Psych negative neurological ROS     GI/Hepatic negative GI ROS, Neg liver ROS,   Endo/Other  Obesity   Renal/GU negative Renal ROS     Musculoskeletal negative musculoskeletal ROS (+)   Abdominal   Peds  Hematology negative hematology ROS (+)   Anesthesia Other Findings   Reproductive/Obstetrics                            Anesthesia Physical Anesthesia Plan  ASA: II  Anesthesia Plan: General   Post-op Pain Management:  Regional for Post-op pain   Induction: Intravenous  PONV Risk Score and Plan: 2 and Midazolam, Dexamethasone and Ondansetron  Airway Management Planned: Oral ETT  Additional Equipment:   Intra-op Plan:   Post-operative Plan: Extubation in OR  Informed Consent: I have reviewed the patients History and Physical, chart, labs and discussed the procedure including the risks, benefits and alternatives for the proposed anesthesia with the patient or authorized representative who has indicated his/her understanding and acceptance.     Dental advisory given  Plan Discussed with: CRNA  Anesthesia Plan Comments: (Femoral nerve block)      Anesthesia Quick Evaluation

## 2020-06-27 ENCOUNTER — Other Ambulatory Visit: Payer: Self-pay

## 2020-06-27 ENCOUNTER — Emergency Department (HOSPITAL_COMMUNITY)
Admission: EM | Admit: 2020-06-27 | Discharge: 2020-06-28 | Disposition: A | Discharge status: Home / Self Care | Payer: Worker's Compensation | Source: Home / Self Care | Attending: Emergency Medicine | Admitting: Emergency Medicine

## 2020-06-27 ENCOUNTER — Emergency Department (HOSPITAL_COMMUNITY): Payer: Worker's Compensation

## 2020-06-27 ENCOUNTER — Ambulatory Visit (HOSPITAL_COMMUNITY): Payer: Worker's Compensation

## 2020-06-27 ENCOUNTER — Encounter (HOSPITAL_COMMUNITY): Payer: Self-pay

## 2020-06-27 ENCOUNTER — Ambulatory Visit (HOSPITAL_COMMUNITY): Payer: Worker's Compensation | Admitting: Anesthesiology

## 2020-06-27 ENCOUNTER — Ambulatory Visit (HOSPITAL_COMMUNITY)
Admission: RE | Admit: 2020-06-27 | Discharge: 2020-06-27 | Disposition: A | Discharge status: Home / Self Care | Payer: Worker's Compensation | Attending: Student | Admitting: Student

## 2020-06-27 ENCOUNTER — Encounter (HOSPITAL_COMMUNITY): Payer: Self-pay | Admitting: Student

## 2020-06-27 ENCOUNTER — Encounter (HOSPITAL_COMMUNITY)
Admission: RE | Disposition: A | Discharge status: Home / Self Care | Payer: Self-pay | Source: Home / Self Care | Attending: Student

## 2020-06-27 DIAGNOSIS — S81011A Laceration without foreign body, right knee, initial encounter: Secondary | ICD-10-CM | POA: Insufficient documentation

## 2020-06-27 DIAGNOSIS — Z6831 Body mass index (BMI) 31.0-31.9, adult: Secondary | ICD-10-CM | POA: Insufficient documentation

## 2020-06-27 DIAGNOSIS — Y990 Civilian activity done for income or pay: Secondary | ICD-10-CM | POA: Insufficient documentation

## 2020-06-27 DIAGNOSIS — Z419 Encounter for procedure for purposes other than remedying health state, unspecified: Secondary | ICD-10-CM

## 2020-06-27 DIAGNOSIS — I10 Essential (primary) hypertension: Secondary | ICD-10-CM | POA: Insufficient documentation

## 2020-06-27 DIAGNOSIS — X58XXXA Exposure to other specified factors, initial encounter: Secondary | ICD-10-CM | POA: Insufficient documentation

## 2020-06-27 DIAGNOSIS — T8130XA Disruption of wound, unspecified, initial encounter: Secondary | ICD-10-CM | POA: Insufficient documentation

## 2020-06-27 DIAGNOSIS — W11XXXA Fall on and from ladder, initial encounter: Secondary | ICD-10-CM | POA: Insufficient documentation

## 2020-06-27 DIAGNOSIS — T148XXA Other injury of unspecified body region, initial encounter: Secondary | ICD-10-CM

## 2020-06-27 DIAGNOSIS — F17290 Nicotine dependence, other tobacco product, uncomplicated: Secondary | ICD-10-CM | POA: Insufficient documentation

## 2020-06-27 DIAGNOSIS — Z79899 Other long term (current) drug therapy: Secondary | ICD-10-CM | POA: Insufficient documentation

## 2020-06-27 DIAGNOSIS — E669 Obesity, unspecified: Secondary | ICD-10-CM | POA: Diagnosis not present

## 2020-06-27 DIAGNOSIS — S82001K Unspecified fracture of right patella, subsequent encounter for closed fracture with nonunion: Secondary | ICD-10-CM

## 2020-06-27 DIAGNOSIS — Z20822 Contact with and (suspected) exposure to covid-19: Secondary | ICD-10-CM | POA: Insufficient documentation

## 2020-06-27 DIAGNOSIS — S82091A Other fracture of right patella, initial encounter for closed fracture: Secondary | ICD-10-CM | POA: Insufficient documentation

## 2020-06-27 HISTORY — PX: ORIF PATELLA: SHX5033

## 2020-06-27 LAB — BASIC METABOLIC PANEL
Anion gap: 7 (ref 5–15)
BUN: 15 mg/dL (ref 6–20)
CO2: 25 mmol/L (ref 22–32)
Calcium: 9.4 mg/dL (ref 8.9–10.3)
Chloride: 106 mmol/L (ref 98–111)
Creatinine, Ser: 1.1 mg/dL (ref 0.61–1.24)
GFR, Estimated: >60 mL/min (ref >60)
Glucose, Bld: 105 mg/dL — ABNORMAL HIGH (ref 70–99)
Potassium: 3.9 mmol/L (ref 3.5–5.1)
Sodium: 138 mmol/L (ref 135–145)

## 2020-06-27 LAB — CBC
HCT: 46.7 % (ref 39.0–52.0)
Hemoglobin: 15 g/dL (ref 13.0–17.0)
MCH: 27.7 pg (ref 26.0–34.0)
MCHC: 32.1 g/dL (ref 30.0–36.0)
MCV: 86.2 fL (ref 80.0–100.0)
Platelets: 288 10*3/uL (ref 150–400)
RBC: 5.42 MIL/uL (ref 4.22–5.81)
RDW: 12.9 % (ref 11.5–15.5)
WBC: 4.2 10*3/uL (ref 4.0–10.5)
nRBC: 0 % (ref 0.0–0.2)

## 2020-06-27 SURGERY — OPEN REDUCTION INTERNAL FIXATION (ORIF) PATELLA
Anesthesia: General | Site: Knee | Laterality: Right

## 2020-06-27 MED ORDER — ROCURONIUM BROMIDE 10 MG/ML (PF) SYRINGE
PREFILLED_SYRINGE | INTRAVENOUS | Status: AC
  Filled 2020-06-27: qty 10

## 2020-06-27 MED ORDER — MIDAZOLAM HCL 2 MG/2ML IJ SOLN
INTRAMUSCULAR | Status: DC | PRN
  Administered 2020-06-27: 2 mg via INTRAVENOUS

## 2020-06-27 MED ORDER — LACTATED RINGERS IV SOLN: INTRAVENOUS | Status: DC

## 2020-06-27 MED ORDER — LIDOCAINE-EPINEPHRINE 1 %-1:100000 IJ SOLN
20.0000 mL | Freq: Once | INTRAMUSCULAR | Status: AC
  Administered 2020-06-28: 20 mL
  Filled 2020-06-27: qty 1

## 2020-06-27 MED ORDER — CHLORHEXIDINE GLUCONATE 0.12 % MT SOLN
15.0000 mL | Freq: Once | OROMUCOSAL | Status: AC
  Administered 2020-06-27: 15 mL via OROMUCOSAL
  Filled 2020-06-27: qty 15

## 2020-06-27 MED ORDER — ORAL CARE MOUTH RINSE: 15.0000 mL | Freq: Once | OROMUCOSAL | Status: AC

## 2020-06-27 MED ORDER — ONDANSETRON HCL 4 MG/2ML IJ SOLN
INTRAMUSCULAR | Status: DC | PRN
  Administered 2020-06-27: 4 mg via INTRAVENOUS

## 2020-06-27 MED ORDER — GABAPENTIN 300 MG PO CAPS
300.0000 mg | ORAL_CAPSULE | Freq: Once | ORAL | Status: AC
  Administered 2020-06-27: 300 mg via ORAL
  Filled 2020-06-27: qty 1

## 2020-06-27 MED ORDER — BUPIVACAINE-EPINEPHRINE (PF) 0.5% -1:200000 IJ SOLN
INTRAMUSCULAR | Status: DC | PRN
  Administered 2020-06-27: 30 mL via PERINEURAL

## 2020-06-27 MED ORDER — OXYCODONE HCL 5 MG PO TABS: 5.0000 mg | ORAL_TABLET | Freq: Four times a day (QID) | ORAL | 0 refills | Status: DC | PRN

## 2020-06-27 MED ORDER — PHENYLEPHRINE 40 MCG/ML (10ML) SYRINGE FOR IV PUSH (FOR BLOOD PRESSURE SUPPORT)
PREFILLED_SYRINGE | INTRAVENOUS | Status: DC | PRN
  Administered 2020-06-27: 120 ug via INTRAVENOUS

## 2020-06-27 MED ORDER — ACETAMINOPHEN 500 MG PO TABS
1000.0000 mg | ORAL_TABLET | Freq: Once | ORAL | Status: AC
  Administered 2020-06-27: 1000 mg via ORAL
  Filled 2020-06-27: qty 2

## 2020-06-27 MED ORDER — DEXAMETHASONE SODIUM PHOSPHATE 10 MG/ML IJ SOLN
INTRAMUSCULAR | Status: DC | PRN
  Administered 2020-06-27: 10 mg via INTRAVENOUS

## 2020-06-27 MED ORDER — DEXAMETHASONE SODIUM PHOSPHATE 10 MG/ML IJ SOLN
INTRAMUSCULAR | Status: AC
  Filled 2020-06-27: qty 1

## 2020-06-27 MED ORDER — VANCOMYCIN HCL 1000 MG IV SOLR
INTRAVENOUS | Status: DC | PRN
  Administered 2020-06-27: 1000 mg via TOPICAL

## 2020-06-27 MED ORDER — VANCOMYCIN HCL 1000 MG IV SOLR
INTRAVENOUS | Status: AC
  Filled 2020-06-27: qty 1000

## 2020-06-27 MED ORDER — PROPOFOL 10 MG/ML IV BOLUS
INTRAVENOUS | Status: DC | PRN
  Administered 2020-06-27: 200 mg via INTRAVENOUS
  Administered 2020-06-27: 70 mg via INTRAVENOUS

## 2020-06-27 MED ORDER — CEFAZOLIN SODIUM-DEXTROSE 2-4 GM/100ML-% IV SOLN
2.0000 g | INTRAVENOUS | Status: AC
  Administered 2020-06-27: 2 g via INTRAVENOUS
  Filled 2020-06-27: qty 100

## 2020-06-27 MED ORDER — CLONIDINE HCL (ANALGESIA) 100 MCG/ML EP SOLN
EPIDURAL | Status: DC | PRN
  Administered 2020-06-27: 100 ug

## 2020-06-27 MED ORDER — OXYCODONE HCL 10 MG PO TABS: 10.0000 mg | ORAL_TABLET | Freq: Four times a day (QID) | ORAL | 0 refills | Status: DC | PRN

## 2020-06-27 MED ORDER — MIDAZOLAM HCL 2 MG/2ML IJ SOLN
INTRAMUSCULAR | Status: AC
  Filled 2020-06-27: qty 2

## 2020-06-27 MED ORDER — LIDOCAINE 2% (20 MG/ML) 5 ML SYRINGE
INTRAMUSCULAR | Status: DC | PRN
  Administered 2020-06-27: 100 mg via INTRAVENOUS

## 2020-06-27 MED ORDER — SUGAMMADEX SODIUM 200 MG/2ML IV SOLN
INTRAVENOUS | Status: DC | PRN
  Administered 2020-06-27: 200 mg via INTRAVENOUS

## 2020-06-27 MED ORDER — DEXMEDETOMIDINE (PRECEDEX) IN NS 20 MCG/5ML (4 MCG/ML) IV SYRINGE
PREFILLED_SYRINGE | INTRAVENOUS | Status: DC | PRN
  Administered 2020-06-27 (×3): 20 ug via INTRAVENOUS

## 2020-06-27 MED ORDER — LIDOCAINE 2% (20 MG/ML) 5 ML SYRINGE
INTRAMUSCULAR | Status: AC
  Filled 2020-06-27: qty 5

## 2020-06-27 MED ORDER — FENTANYL CITRATE (PF) 250 MCG/5ML IJ SOLN
INTRAMUSCULAR | Status: AC
  Filled 2020-06-27: qty 5

## 2020-06-27 MED ORDER — ONDANSETRON HCL 4 MG/2ML IJ SOLN
INTRAMUSCULAR | Status: AC
  Filled 2020-06-27: qty 2

## 2020-06-27 MED ORDER — ASPIRIN EC 81 MG PO TBEC: 81.0000 mg | DELAYED_RELEASE_TABLET | Freq: Every day | ORAL | 0 refills | Status: DC

## 2020-06-27 MED ORDER — PROPOFOL 10 MG/ML IV BOLUS
INTRAVENOUS | Status: AC
  Filled 2020-06-27: qty 20

## 2020-06-27 MED ORDER — 0.9 % SODIUM CHLORIDE (POUR BTL) OPTIME
TOPICAL | Status: DC | PRN
  Administered 2020-06-27: 1000 mL

## 2020-06-27 MED ORDER — HYDROMORPHONE HCL 1 MG/ML IJ SOLN
1.0000 mg | Freq: Once | INTRAMUSCULAR | Status: AC
  Administered 2020-06-27: 1 mg via INTRAMUSCULAR
  Filled 2020-06-27: qty 1

## 2020-06-27 MED ORDER — EPHEDRINE SULFATE-NACL 50-0.9 MG/10ML-% IV SOSY
PREFILLED_SYRINGE | INTRAVENOUS | Status: DC | PRN
  Administered 2020-06-27: 10 mg via INTRAVENOUS

## 2020-06-27 MED ORDER — ROCURONIUM BROMIDE 10 MG/ML (PF) SYRINGE
PREFILLED_SYRINGE | INTRAVENOUS | Status: DC | PRN
  Administered 2020-06-27: 70 mg via INTRAVENOUS

## 2020-06-27 MED ORDER — FENTANYL CITRATE (PF) 250 MCG/5ML IJ SOLN
INTRAMUSCULAR | Status: DC | PRN
  Administered 2020-06-27: 200 ug via INTRAVENOUS
  Administered 2020-06-27: 50 ug via INTRAVENOUS

## 2020-06-27 SURGICAL SUPPLY — 82 items
ADH SKN CLS APL DERMABOND .7 (GAUZE/BANDAGES/DRESSINGS)
APL PRP STRL LF DISP 70% ISPRP (MISCELLANEOUS) ×1
BANDAGE ESMARK 6X9 LF (GAUZE/BANDAGES/DRESSINGS) IMPLANT
BIT DRILL 2.7XCANN QCK CNCT (BIT) ×1 IMPLANT
BIT DRILL CANN 2.7 (BIT) ×2
BIT DRILL CANN 2.7MM (BIT) ×1
BIT DRL 2.7XCANN QCK CNCT (BIT) ×1
BLADE CLIPPER SURG (BLADE) IMPLANT
BNDG CMPR 9X6 STRL LF SNTH (GAUZE/BANDAGES/DRESSINGS)
BNDG ELASTIC 4X5.8 VLCR STR LF (GAUZE/BANDAGES/DRESSINGS) ×3 IMPLANT
BNDG ELASTIC 6X5.8 VLCR STR LF (GAUZE/BANDAGES/DRESSINGS) ×3 IMPLANT
BNDG ESMARK 6X9 LF (GAUZE/BANDAGES/DRESSINGS)
CHLORAPREP W/TINT 26 (MISCELLANEOUS) ×3 IMPLANT
CLOSURE STERI-STRIP 1/2X4 (GAUZE/BANDAGES/DRESSINGS) ×1
CLSR STERI-STRIP ANTIMIC 1/2X4 (GAUZE/BANDAGES/DRESSINGS) ×2 IMPLANT
CNTNR URN SCR LID CUP LEK RST (MISCELLANEOUS) ×1 IMPLANT
CONT SPEC 4OZ STRL OR WHT (MISCELLANEOUS) ×3
COVER SURGICAL LIGHT HANDLE (MISCELLANEOUS) ×3 IMPLANT
COVER WAND RF STERILE (DRAPES) IMPLANT
CUFF TOURN SGL QUICK 34 (TOURNIQUET CUFF) ×3
CUFF TOURN SGL QUICK 42 (TOURNIQUET CUFF) IMPLANT
CUFF TRNQT CYL 34X4.125X (TOURNIQUET CUFF) ×1 IMPLANT
DECANTER SPIKE VIAL GLASS SM (MISCELLANEOUS) IMPLANT
DERMABOND ADVANCED (GAUZE/BANDAGES/DRESSINGS)
DERMABOND ADVANCED .7 DNX12 (GAUZE/BANDAGES/DRESSINGS) IMPLANT
DRAPE C-ARM 42X72 X-RAY (DRAPES) ×3 IMPLANT
DRAPE C-ARMOR (DRAPES) ×3 IMPLANT
DRAPE HALF SHEET 40X57 (DRAPES) ×3 IMPLANT
DRAPE IMP U-DRAPE 54X76 (DRAPES) ×3 IMPLANT
DRAPE ORTHO SPLIT 77X108 STRL (DRAPES) ×6
DRAPE SURG ORHT 6 SPLT 77X108 (DRAPES) ×2 IMPLANT
DRAPE U-SHAPE 47X51 STRL (DRAPES) ×3 IMPLANT
ELECT REM PT RETURN 9FT ADLT (ELECTROSURGICAL) ×3
ELECTRODE REM PT RTRN 9FT ADLT (ELECTROSURGICAL) ×1 IMPLANT
GAUZE SPONGE 4X4 12PLY STRL (GAUZE/BANDAGES/DRESSINGS) ×3 IMPLANT
GLOVE BIO SURGEON STRL SZ 6.5 (GLOVE) ×6 IMPLANT
GLOVE BIO SURGEON STRL SZ7.5 (GLOVE) ×12 IMPLANT
GLOVE BIO SURGEONS STRL SZ 6.5 (GLOVE) ×3
GLOVE BIOGEL PI IND STRL 7.5 (GLOVE) ×1 IMPLANT
GLOVE BIOGEL PI INDICATOR 7.5 (GLOVE) ×2
GLOVE SURG UNDER POLY LF SZ6.5 (GLOVE) ×3 IMPLANT
GOWN STRL REUS W/ TWL LRG LVL3 (GOWN DISPOSABLE) ×2 IMPLANT
GOWN STRL REUS W/TWL LRG LVL3 (GOWN DISPOSABLE) ×6
GUIDEWIRE PIN ORTH 6X1.6XSMTH (WIRE) ×1 IMPLANT
IMMOBILIZER KNEE 22 UNIV (SOFTGOODS) ×3 IMPLANT
K-WIRE 1.6 (WIRE) ×3
KIT BASIN OR (CUSTOM PROCEDURE TRAY) ×3 IMPLANT
KIT PLATELET STD 3200 60X6 (KITS) ×3 IMPLANT
KIT TURNOVER KIT B (KITS) ×3 IMPLANT
MANIFOLD NEPTUNE II (INSTRUMENTS) ×3 IMPLANT
NEEDLE 22X1 1/2 (OR ONLY) (NEEDLE) IMPLANT
NS IRRIG 1000ML POUR BTL (IV SOLUTION) ×3 IMPLANT
PACK GENERAL/GYN (CUSTOM PROCEDURE TRAY) ×3 IMPLANT
PAD ARMBOARD 7.5X6 YLW CONV (MISCELLANEOUS) ×6 IMPLANT
PAD CAST 3X4 CTTN HI CHSV (CAST SUPPLIES) ×1 IMPLANT
PADDING CAST COTTON 3X4 STRL (CAST SUPPLIES) ×3
PADDING CAST COTTON 6X4 STRL (CAST SUPPLIES) ×3 IMPLANT
PUTTY DBM STAGRAFT PLUS 5CC (Putty) ×3 IMPLANT
RETRIEVER SUT HEWSON (MISCELLANEOUS) IMPLANT
SCREW CANN .5 THRD RVRS CUT (Screw) ×1 IMPLANT
SCREW CANN 4X46 (Screw) ×3 IMPLANT
SCREW CANNULATED 4.0X44MM (Screw) ×3 IMPLANT
SUCTION FRAZIER TIP 8 FR DISP (SUCTIONS) ×2
SUCTION TUBE FRAZIER 8FR DISP (SUCTIONS) ×1 IMPLANT
SUT FIBERWIRE #2 38 T-5 BLUE (SUTURE)
SUT FIBERWIRE #5 38 CONV NDL (SUTURE)
SUT MNCRL AB 3-0 PS2 18 (SUTURE) ×3 IMPLANT
SUT MON AB 3-0 SH 27 (SUTURE) ×3
SUT MON AB 3-0 SH27 (SUTURE) ×1 IMPLANT
SUT VIC AB 0 CT1 27 (SUTURE)
SUT VIC AB 0 CT1 27XBRD ANBCTR (SUTURE) IMPLANT
SUT VIC AB 0 CT1 36 (SUTURE) ×3 IMPLANT
SUT VIC AB 1 CT1 27 (SUTURE)
SUT VIC AB 1 CT1 27XBRD ANBCTR (SUTURE) IMPLANT
SUT VIC AB 2-0 CT1 27 (SUTURE) ×3
SUT VIC AB 2-0 CT1 TAPERPNT 27 (SUTURE) ×1 IMPLANT
SUTURE FIBERWR #2 38 T-5 BLUE (SUTURE) IMPLANT
SUTURE FIBERWR #5 38 CONV NDL (SUTURE) IMPLANT
SYR CONTROL 10ML LL (SYRINGE) IMPLANT
TOWEL GREEN STERILE (TOWEL DISPOSABLE) ×3 IMPLANT
UNDERPAD 30X36 HEAVY ABSORB (UNDERPADS AND DIAPERS) ×3 IMPLANT
WATER STERILE IRR 1000ML POUR (IV SOLUTION) IMPLANT

## 2020-06-27 NOTE — Discharge Instructions (Addendum)
Orthopaedic Trauma Service Discharge Instructions   General Discharge Instructions  WEIGHT BEARING STATUS: Weightbearing as tolerated with hinge brace locked in full extension.   RANGE OF MOTION/ACTIVITY: Ok for unrestricted range of motion in knee with hinge brace on at rest  Wound Care: You may remove your surgical dressing on post-op day #3 (Saturday 06/30/20). Leave the yellow steri-strips in place. Incisions can be left open to air if there is no drainage. If incision continues to have drainage, follow wound care instructions below. Okay to shower if no drainage from incisions.  DVT/PE prophylaxis: Aspirin 81 mg  Diet: as you were eating previously.  Can use over the counter stool softeners and bowel preparations, such as Miralax, to help with bowel movements.  Narcotics can be constipating.  Be sure to drink plenty of fluids  PAIN MEDICATION USE AND EXPECTATIONS  You have likely been given narcotic medications to help control your pain.  After a traumatic event that results in an fracture (broken bone) with or without surgery, it is ok to use narcotic pain medications to help control one's pain.  We understand that everyone responds to pain differently and each individual patient will be evaluated on a regular basis for the continued need for narcotic medications. Ideally, narcotic medication use should last no more than 6-8 weeks (coinciding with fracture healing).   As a patient it is your responsibility as well to monitor narcotic medication use and report the amount and frequency you use these medications when you come to your office visit.   We would also advise that if you are using narcotic medications, you should take a dose prior to therapy to maximize you participation.  IF YOU ARE ON NARCOTIC MEDICATIONS IT IS NOT PERMISSIBLE TO OPERATE A MOTOR VEHICLE (MOTORCYCLE/CAR/TRUCK/MOPED) OR HEAVY MACHINERY DO NOT MIX NARCOTICS WITH OTHER CNS (CENTRAL NERVOUS SYSTEM) DEPRESSANTS SUCH  AS ALCOHOL   STOP SMOKING OR USING NICOTINE PRODUCTS!!!!  As discussed nicotine severely impairs your body's ability to heal surgical and traumatic wounds but also impairs bone healing.  Wounds and bone heal by forming microscopic blood vessels (angiogenesis) and nicotine is a vasoconstrictor (essentially, shrinks blood vessels).  Therefore, if vasoconstriction occurs to these microscopic blood vessels they essentially disappear and are unable to deliver necessary nutrients to the healing tissue.  This is one modifiable factor that you can do to dramatically increase your chances of healing your injury.    (This means no smoking, no nicotine gum, patches, etc)  DO NOT USE NONSTEROIDAL ANTI-INFLAMMATORY DRUGS (NSAID'S)  Using products such as Advil (ibuprofen), Aleve (naproxen), Motrin (ibuprofen) for additional pain control during fracture healing can delay and/or prevent the healing response.  If you would like to take over the counter (OTC) medication, Tylenol (acetaminophen) is ok.  However, some narcotic medications that are given for pain control contain acetaminophen as well. Therefore, you should not exceed more than 4000 mg of tylenol in a day if you do not have liver disease.  Also note that there are may OTC medicines, such as cold medicines and allergy medicines that my contain tylenol as well.  If you have any questions about medications and/or interactions please ask your doctor/PA or your pharmacist.      ICE AND ELEVATE INJURED/OPERATIVE EXTREMITY  Using ice and elevating the injured extremity above your heart can help with swelling and pain control.  Icing in a pulsatile fashion, such as 20 minutes on and 20 minutes off, can be followed.  Do not place ice directly on skin. Make sure there is a barrier between to skin and the ice pack.    Using frozen items such as frozen peas works well as the conform nicely to the are that needs to be iced.  USE AN ACE WRAP OR TED HOSE FOR SWELLING  CONTROL  In addition to icing and elevation, Ace wraps or TED hose are used to help limit and resolve swelling.  It is recommended to use Ace wraps or TED hose until you are informed to stop.    When using Ace Wraps start the wrapping distally (farthest away from the body) and wrap proximally (closer to the body)   Example: If you had surgery on your leg or thing and you do not have a splint on, start the ace wrap at the toes and work your way up to the thigh        If you had surgery on your upper extremity and do not have a splint on, start the ace wrap at your fingers and work your way up to the upper arm  CALL THE OFFICE WITH ANY QUESTIONS OR CONCERNS: [phone]   VISIT OUR WEBSITE FOR ADDITIONAL INFORMATION: [email or web address]    Discharge Wound Care Instructions  Do NOT apply any ointments, solutions or lotions to pin sites or surgical wounds.  These prevent needed drainage and even though solutions like hydrogen peroxide kill bacteria, they also damage cells lining the pin sites that help fight infection.  Applying lotions or ointments can keep the wounds moist and can cause them to breakdown and open up as well. This can increase the risk for infection. When in doubt call the office.  If any drainage is noted, use gauze, Kerlix, and an ace wrap.  Once the incision is completely dry and without drainage, it may be left open to air out.  Showering may begin 36-48 hours later.  Cleaning gently with soap and water.

## 2020-06-27 NOTE — Anesthesia Procedure Notes (Signed)
Procedure Name: LMA Insertion Date/Time: 06/27/2020 8:44 AM Performed by: Rosiland Oz, CRNA Pre-anesthesia Checklist: Patient identified, Emergency Drugs available, Suction available, Patient being monitored and Timeout performed Patient Re-evaluated:Patient Re-evaluated prior to induction Oxygen Delivery Method: Circle system utilized Preoxygenation: Pre-oxygenation with 100% oxygen Induction Type: IV induction Ventilation: Mask ventilation without difficulty Laryngoscope Size: Miller and 3 Grade View: Grade I Tube type: Oral Tube size: 7.5 mm Number of attempts: 1 Airway Equipment and Method: Stylet Placement Confirmation: ETT inserted through vocal cords under direct vision,  positive ETCO2 and breath sounds checked- equal and bilateral Secured at: 22 cm Tube secured with: Tape Dental Injury: Teeth and Oropharynx as per pre-operative assessment

## 2020-06-27 NOTE — Progress Notes (Signed)
Orthopedic Tech Progress Note Patient Details:  Jason [name] May 07, 1985 [id number] Called in order to [employer] for a BIOTE[employer] HINGED KNEE BRACE Patient ID: [name], male   DOB: 08-Sep-1985, 35 y.o.   MRN: [id number]   Donald Pore 06/27/2020, 10:41 AM

## 2020-06-27 NOTE — ED Triage Notes (Signed)
Had right patella surgery today and when he got home started bleeding around dressing. Pt states the ortho device is crooked.   Pt actively bleeding when dressing opened.

## 2020-06-27 NOTE — ED Triage Notes (Signed)
Emergency Medicine Provider Triage Evaluation Note  Jason [name] , a 35 y.o. male  was evaluated in triage.  Pt complaints of right knee pain & bleeding from surgical site that began shortly PTA.  Patient underwent open reduction internal fixation of the patella nonunion with iliac crest marrow aspiration by Dr. Jena Gauss this AM. Was discharged home in knee immobilizer, issue with the brace tonight, bent in medially and the knee gave out with subsequent onset of pain & bleeding from surgical site through dressing.   Review of Systems  Positive: Arthralgia, wound Negative: Numbness, fever  Physical Exam  BP (!) 159/93 (BP Location: Left Arm)   Pulse (!) 124   Temp 98.6 F (37 C) (Oral)   Resp 17   SpO2 96%  Gen:   Awake HEENT:  Atraumatic  Resp:  Normal effort Cardiac:  Tachycardic, 2+ symmetric Dp pulses.  Abd:   Nondistended, nontender  MSK:   RLE: Knee immobilizer present with ace wrap. Ace wrap just superior to the knee with blood present on the bandage, partially removed bandage, 2.5 cm wound with mild active bleeding, pressure applied & re-bandaged  Neuro:  Speech clear, sensation grossly intact to bilateral LEs.   Medical Decision Making  Medically screening exam initiated at 10:35 PM.  Appropriate orders placed.  Jason [name] was informed that the remainder of the evaluation will be completed by another provider, this initial triage assessment does not replace that evaluation, and the importance of remaining in the ED until their evaluation is complete.  Clinical Impression   Knee pain & bleeding from wound, S/p surgical intervention this AM, difficult to fully evaluate in triage, mild active bleeding when bandage partially removed. Will place patient in exam room in main ED for further evaluation.    Cherly Anderson, PA-C 06/27/20 2250

## 2020-06-27 NOTE — ED Provider Notes (Signed)
[facility]ENC[facility] Emergency Department Provider Note MRN:  [id number]  Arrival date & time: 06/28/20     Chief Complaint   Post-op Problem   History of Present Illness   Jason [name] is a 35 y.o. year-old male with a history of hypertension presenting to the ED with chief complaint of postop problem.  Patient had a surgery to repair a broken patella earlier today.  He was getting into bed and the brace bent.  Was supposed to stay straight, thought it was locked.  When his knee flexed, his wound opened up and now he is having bleeding and increased pain.  Pain is moderate to severe, constant, located in the right knee, no other injuries from the event.  Review of Systems  A complete 10 system review of systems was obtained and all systems are negative except as noted in the HPI and PMH.   Patient's Health History    Past Medical History:  Diagnosis Date  . HTN (hypertension) 10/07/2018  . Hypertension     Past Surgical History:  Procedure Laterality Date  . KNEE SURGERY    . LAPAROSCOPIC APPENDECTOMY N/A 01/10/2020   Procedure: APPENDECTOMY LAPAROSCOPIC;  Surgeon: Abigail Miyamoto, MD;  Location: [facility];  Service: General;  Laterality: N/A;    History reviewed. No pertinent family history.  Social History   Socioeconomic History  . Marital status: Single    Spouse name: Not on file  . Number of children: 2  . Years of education: Not on file  . Highest education level: Not on file  Occupational History  . Not on file  Tobacco Use  . Smoking status: Current Some Day Smoker    Types: Cigars  . Smokeless tobacco: Never Used  . Tobacco comment: "every blue moon"  Vaping Use  . Vaping Use: Never used  Substance and Sexual Activity  . Alcohol use: Not Currently  . Drug use: Not Currently  . Sexual activity: Not on file  Other Topics Concern  . Not on file  Social History Narrative  . Not on file   Social Determinants of Health   Financial  Resource Strain: Not on file  Food Insecurity: Not on file  Transportation Needs: Not on file  Physical Activity: Not on file  Stress: Not on file  Social Connections: Not on file  Intimate Partner Violence: Not on file     Physical Exam   Vitals:   06/28/20 0045 06/28/20 0100  BP: 130/83 130/77  Pulse: 98 95  Resp:    Temp:    SpO2: 98% 100%    CONSTITUTIONAL: Well-appearing, NAD NEURO:  Alert and oriented x 3, no focal deficits EYES:  eyes equal and reactive ENT/NECK:  no LAD, no JVD CARDIO: Tachycardic rate, well-perfused, normal S1 and S2 PULM:  CTAB no wheezing or rhonchi GI/GU:  normal bowel sounds, non-distended, non-tender MSK/SPINE: Right knee with bloodsoaked dressings, when removed there is partial dehiscence of the anterior wound superior to the patella, small amount of soft tissue protrusion, minimal oozing of blood; neurovascularly intact distally SKIN:  no rash, atraumatic PSYCH:  Appropriate speech and behavior  *Additional and/or pertinent findings included in MDM below  Diagnostic and Interventional Summary    EKG Interpretation  Date/Time:    Ventricular Rate:    PR Interval:    QRS Duration:   QT Interval:    QTC Calculation:   R Axis:     Text Interpretation:        Labs  Reviewed - No data to display  DG Knee 2 Views Right  Final Result      Medications  lidocaine-EPINEPHrine (XYLOCAINE W/EPI) 1 %-1:100000 (with pres) injection 20 mL (has no administration in time range)  HYDROmorphone (DILAUDID) injection 1 mg (1 mg Intramuscular Given 06/27/20 2347)     Procedures  /  Critical Care .Marland KitchenLaceration Repair  Date/Time: 06/28/2020 1:17 AM Performed by: Sabas Sous, MD Authorized by: Sabas Sous, MD   Consent:    Consent obtained:  Verbal   Consent given by:  Patient   Risks, benefits, and alternatives were discussed: yes     Risks discussed:  Infection, need for additional repair, nerve damage, poor wound healing, poor cosmetic  result, pain, retained foreign body, tendon damage and vascular damage Universal protocol:    Procedure explained and questions answered to patient or proxy's satisfaction: yes     Patient identity confirmed:  Verbally with patient Anesthesia:    Anesthesia method:  Local infiltration   Local anesthetic:  Lidocaine 1% WITH epi Laceration details:    Location:  Leg   Leg location:  R knee   Length (cm):  4   Depth (mm):  3 Pre-procedure details:    Preparation:  Patient was prepped and draped in usual sterile fashion Exploration:    Hemostasis achieved with:  Direct pressure   Imaging obtained: x-ray     Imaging outcome: foreign body not noted     Wound exploration: wound explored through full range of motion     Contaminated: no   Treatment:    Area cleansed with:  Saline   Amount of cleaning:  Standard   Debridement:  None   Undermining:  None   Scar revision: no   Skin repair:    Repair method:  Sutures   Suture size:  3-0   Suture material:  Nylon   Suture technique:  Simple interrupted   Number of sutures:  7 Approximation:    Approximation:  Close Repair type:    Repair type:  Simple Post-procedure details:    Dressing:  Antibiotic ointment and bulky dressing   Procedure completion:  Tolerated well, no immediate complications    ED Course and Medical Decision Making  I have reviewed the triage vital signs, the nursing notes, and pertinent available records from the EMR.  Listed above are laboratory and imaging tests that I personally ordered, reviewed, and interpreted and then considered in my medical decision making (see below for details).  Discussed case with Dr. Jena Gauss over the phone, will provide pain management obtain screening x-ray, however not much else to be done this evening other than close the dehiscence.     X-ray reveals some bending of hardware.  Wound closed as described above.  Dr. Jena Gauss made aware of the hardware issue, will see in the office  within the next few days.  Pain well controlled, appropriate for discharge.  Elmer Sow. Pilar Plate, MD [facility] Emergency Medicine [facility] [email or web address]  Final Clinical Impressions(s) / ED Diagnoses     ICD-10-CM   1. Wound dehiscence  T81.30XA     ED Discharge Orders    None       Discharge Instructions Discussed with and Provided to Patient:     Discharge Instructions     You were evaluated in the Emergency Department and after careful evaluation, we did not find any emergent condition requiring admission or further testing in the hospital.  You popped open  some stitches from your fall.  We replaced here in the emergency department.  Your x-ray shows some bending of the screws from the surgery earlier today.  You will need to follow-up with Dr. Jena Gauss within the next few days for evaluation.  Please return to the Emergency Department if you experience any worsening of your condition.  Thank you for allowing [place] to be a part of your care.        Sabas Sous, MD 06/28/20 [phone]

## 2020-06-27 NOTE — Anesthesia Procedure Notes (Signed)
Anesthesia Regional Block: Femoral nerve block   Pre-Anesthetic Checklist: ,, timeout performed, Correct Patient, Correct Site, Correct Laterality, Correct Procedure, Correct Position, site marked, Risks and benefits discussed,  Surgical consent,  Pre-op evaluation,  At surgeon's request and post-op pain management  Laterality: Right  Prep: chloraprep       Needles:  Injection technique: Single-shot  Needle Type: Echogenic Stimulator Needle     Needle Length: 9cm  Needle Gauge: 21     Additional Needles:   Narrative:  Start time: 06/27/2020 7:50 AM End time: 06/27/2020 8:00 AM Injection made incrementally with aspirations every 5 mL.  Performed by: Personally  Anesthesiologist: Cecile Hearing, MD  Additional Notes: Pt. tolerated procedure well. Good perineural spread visualized on [place].

## 2020-06-27 NOTE — H&P (Signed)
Orthopaedic Trauma Service (OTS) Consult   Patient ID: Jason [name] MRN: [id number] DOB/AGE: 1985/11/11 35 y.o.  Reason for Surgery: R patella nonunion  HPI: Jason [name] is an 35 y.o. male who presents for right patella nonunion surgery.  Patient injured his knee while working falling off a ladder.  This was a July of last year.  He underwent physical therapy as well as bracing with no success and alleviation of the pain.  He had an MRI which showed no intra-articular pathology but did show some increased signal through the potential nonunion of his patella.  A bone stimulator was instituted as well as bracing but he continues have severe pain and limitation in his ability to walk.  After full discussion patient wished to proceed with surgical intervention.  Past Medical History:  Diagnosis Date  . HTN (hypertension) 10/07/2018  . Hypertension     Past Surgical History:  Procedure Laterality Date  . KNEE SURGERY    . LAPAROSCOPIC APPENDECTOMY N/A 01/10/2020   Procedure: APPENDECTOMY LAPAROSCOPIC;  Surgeon: Abigail Miyamoto, MD;  Location: [facility];  Service: General;  Laterality: N/A;    History reviewed. No pertinent family history.  Social History:  reports that he has been smoking cigars. He has never used smokeless tobacco. He reports previous alcohol use. He reports previous drug use.  Allergies: No Known Allergies  Medications:  No current facility-administered medications on file prior to encounter.   Current Outpatient Medications on File Prior to Encounter  Medication Sig Dispense Refill  . amLODipine (NORVASC) 10 MG tablet Take 1 tablet (10 mg total) by mouth daily. 30 tablet 1  . atorvastatin (LIPITOR) 80 MG tablet Take 80 mg by mouth daily.    Marland Kitchen lisinopril-hydrochlorothiazide (ZESTORETIC) 20-25 MG tablet Take 1 tablet by mouth daily.    Marland Kitchen oxyCODONE (OXY IR/ROXICODONE) 5 MG immediate release tablet Take 1 tablet (5 mg total) by mouth every 6 (six) hours as needed  for breakthrough pain. 15 tablet 0  . acetaminophen (TYLENOL) 500 MG tablet Take 2 tablets (1,000 mg total) by mouth every 6 (six) hours. 30 tablet 0  . ondansetron (ZOFRAN-ODT) 4 MG disintegrating tablet Take 1 tablet (4 mg total) by mouth every 6 (six) hours as needed for nausea. 20 tablet 0    ROS: Constitutional: No fever or chills Vision: No changes in vision ENT: No difficulty swallowing CV: No chest pain Pulm: No SOB or wheezing GI: No nausea or vomiting GU: No urgency or inability to hold urine Skin: No poor wound healing Neurologic: No numbness or tingling Psychiatric: No depression or anxiety Heme: No bruising Allergic: No reaction to medications or food   Exam: Blood pressure (!) 141/93, pulse 76, temperature 98.5 F (36.9 C), temperature source Oral, resp. rate 17, height 6' (1.829 m), weight 106.6 kg, SpO2 100 %. General: No acute distress Orientation: Awake alert and oriented x3 Mood and Affect: Cooperative and pleasant Gait: Noticeably antalgic gait Coordination and balance: Within normal limits  Right lower extremity: Swelling of the knee diffuse tenderness through the patella.  Limited range of motion secondary to pain.  Stable ligamentous exam.  Neurovascularly intact distally.  Left lower extremity: Skin without lesions. No tenderness to palpation. Full painless ROM, full strength in each muscle groups without evidence of instability.   Medical Decision Making: Data: Imaging: X-rays of the right knee show a patellar nonunion with continued distraction at the fracture site.  Labs:  Results for orders placed or performed during the hospital encounter of 06/27/20 (  from the past 24 hour(s))  CBC per protocol     Status: None   Collection Time: 06/27/20  7:02 AM  Result Value Ref Range   WBC 4.2 4.0 - 10.5 K/uL   RBC 5.42 4.22 - 5.81 MIL/uL   Hemoglobin 15.0 13.0 - 17.0 g/dL   HCT 34.1 93.7 - 90.2 %   MCV 86.2 80.0 - 100.0 fL   MCH 27.7 26.0 - 34.0 pg    MCHC 32.1 30.0 - 36.0 g/dL   RDW 40.9 73.5 - 32.9 %   Platelets 288 150 - 400 K/uL   nRBC 0.0 0.0 - 0.2 %    Imaging or Labs ordered: None  Medical history and chart was reviewed and case discussed with medical provider.  Assessment/Plan: 35 year old male with a right patella nonunion.  Patient has failed nonoperative management.  He continues to have severe pain limiting his ability to return to work and ambulate.  We will plan for debridement of the nonunion site with open reduction internal fixation.  I also plan to harvest bone marrow aspirate and to place at the nonunion site.  Risks and benefits were discussed with the patient.  Risks include but not limited to bleeding, infection, continued nonunion, knee stiffness, continued pain, nerve and blood vessel injury, DVT, even the possibility anesthetic complications.  The patient agreed to proceed with surgery and consent was obtained.  Roby Lofts, MD [facility] [phone] (office) [email or web address]

## 2020-06-27 NOTE — Op Note (Signed)
Orthopaedic Surgery Operative Note (CSN: [id number] ) Date of Surgery: 06/27/2020  Admit Date: 06/27/2020   Diagnoses: Pre-Op Diagnoses: Right patellar nonunion  Post-Op Diagnosis: Same  Procedures: CPT 27524-Open repair of right patellar nonunion with iliac crest bone marrow aspiration  Surgeons : Primary: Roby Lofts, MD  Assistant: Ulyses Southward, PA-C  Location: OR 7   Anesthesia:General with regional  Antibiotics: Ancef 2g preop with 1 gm vancomycin powder placed topically   Tourniquet time:None  Estimated Blood Loss:50 mL  Complications:None  Specimens:None   Implants: Implant Name Type Inv. Item Serial No. Manufacturer Lot No. LRB No. Used Action  PUTTY DBM STAGRAFT PLUS 5CC - [id number] Putty PUTTY DBM STAGRAFT PLUS 5CC  [employer](ORTH,TRAU,BIO,SG) [id number] Right 1 Implanted  4.0 1/2 Thread Screw x44    Zimmer  Right 1 Implanted  SCREW CANN 4X46 - [id number] Screw SCREW CANN 4X46  [employer](ORTH,TRAU,BIO,SG)  Right 1 Implanted     Indications for Surgery: 35 year old male who sustained an injury to his right knee July 2021.  He subsequently continued severe pain.  MRI was performed which showed a patellar nonunion.  He was continuing to be symptomatic after multiple bouts of conservative management including therapy and bracing.  He continued to have a severe limp, pain and limited ability to return to work.  As result I felt that he was indicated for repair of his nonunion with iliac crest bone marrow aspirate.  I discussed risks and benefits with the patient.  Risks included but not limited to bleeding, infection, continued nonunion, nerve or blood vessel injury, continued pain, knee stiffness, loss of fixation, need for further surgeries, even the possibility anesthetic complications.  Patient agreed to proceed with surgery and consent was obtained.  Operative Findings: Open repair of right patella nonunion with fixation using [employer]immer [employer] stainless steel 4.0  mm cannulated screws with placement of stay graft bone putty mixed with bone marrow aspirate concentrate  Procedure: The patient was identified in the preoperative holding area. Consent was confirmed with the patient and their family and all questions were answered. The operative extremity was marked after confirmation with the patient. he was then brought back to the operating room by our anesthesia colleagues.  He was placed under general anesthetic and carefully transferred over to a radiolucent flat top table.  His right lower extremity was prepped and draped in usual sterile fashion.  A timeout was performed to verify the patient, the procedure, and the extremity.  Preoperative antibiotics were dosed.  Fluoroscopic imaging was obtained that show the nonunion site.  An incision was made through his previous scar was carried down through skin and subcutaneous tissue.  I then perform subperiosteal dissection to expose the anterior surface of his patella.  I used fluoroscopy as a guide to find the nonunion site.  I then used a mixture of curette and ronguer to remove the fibrous tissue between the superior pole and main body of the patella.  I extended this all the way down to the joint and was able to mobilize the superior pole fragment.  I then used a drill bit to penetrate the cortical ends of the nonunion site.  I was able to successfully prepare the entirety of the site for fixation.  I then turned my attention to harvesting the bone marrow aspirate.  Using a harvesting needle I felt the iliac crest superior to the ASIS and percutaneously placed a needle between the 2 tables of the pelvis.  I was able then to  aspirate approximately 50 cc of bone marrow.  This would anticoagulant and spun this down with the centrifuge.  I then combined the bone marrow aspirate with 5 mL of stay graft putty.  I then reduced the nonunion site with a reduction tenaculum and then used K wires for cannulated screws to  appropriate position fixation.  I confirmed position with fluoroscopy.  I then pack the nonunion site with a stay graft putty combined with the bone marrow concentrate aspirate and drilled and placed the partially-threaded cannulated screws.  Excellent fixation was obtained.  Final fluoroscopic imaging was obtained.  The incision was irrigated.  I placed more of the bone marrow aspirate concentrate after I irrigated the wound.  I then performed a layered closure of 0 Vicryl, 2-0 Vicryl and 3-0 Monocryl with Steri-Strips.  Sterile dressing was placed.  Patient was then awoken from anesthesia and taken to the PACU in stable condition.  Post Op Plan/Instructions: The patient will be weightbearing as tolerated with a hinged knee brace locked in extension.  I will allow for unrestricted range of motion for therapy and movement of the knee when he is not ambulating.  He will be allowed an 81 mg of aspirin for DVT prophylaxis.  We will have her return in approximately 2 weeks for x-rays and wound check.  He will discharge home from the PACU.  I was present and performed the entire surgery.  Ulyses Southward, PA-C did assist me throughout the case. An assistant was necessary given the difficulty in approach, maintenance of reduction and ability to instrument the fracture.   Truitt Merle, MD [facility]

## 2020-06-27 NOTE — Transfer of Care (Signed)
Immediate Anesthesia Transfer of Care Note  Patient: Jason [name]  Procedure(s) Performed: OPEN REDUCTION INTERNAL (ORIF) FIXATION PATELLA  NONUNION WITH ILIAC CREST MARROW ASPIRATION (Right Knee)  Patient Location: PACU  Anesthesia Type:General  Level of Consciousness: drowsy and patient cooperative  Airway & Oxygen Therapy: Patient Spontanous Breathing and Patient connected to face mask oxygen  Post-op Assessment: Report given to RN and Post -op Vital signs reviewed and stable  Post vital signs: Reviewed and stable  Last Vitals:  Vitals Value Taken Time  BP 122/89 06/27/20 1040  Temp    Pulse 70 06/27/20 1041  Resp 22 06/27/20 1041  SpO2 92 % 06/27/20 1041  Vitals shown include unvalidated device data.  Last Pain:  Vitals:   06/27/20 0728  TempSrc:   PainSc: 10-Worst pain ever      Patients Stated Pain Goal: 5 (06/27/20 0728)  Complications: No complications documented.

## 2020-06-28 ENCOUNTER — Encounter (HOSPITAL_COMMUNITY): Payer: Self-pay | Admitting: Student

## 2020-06-28 NOTE — Anesthesia Postprocedure Evaluation (Signed)
Anesthesia Post Note  Patient: Jason [name]  Procedure(s) Performed: OPEN REDUCTION INTERNAL (ORIF) FIXATION PATELLA  NONUNION WITH ILIAC CREST MARROW ASPIRATION (Right Knee)     Patient location during evaluation: PACU Anesthesia Type: General Level of consciousness: awake and alert Pain management: pain level controlled Vital Signs Assessment: post-procedure vital signs reviewed and stable Respiratory status: spontaneous breathing, nonlabored ventilation, respiratory function stable and patient connected to nasal cannula oxygen Cardiovascular status: blood pressure returned to baseline and stable Postop Assessment: no apparent nausea or vomiting Anesthetic complications: no   No complications documented.  Last Vitals:  Vitals:   06/27/20 1055 06/27/20 1110  BP: (!) 132/94 126/88  Pulse: 85 76  Resp: (!) 21 11  Temp:  36.9 C  SpO2: 96% 98%    Last Pain:  Vitals:   06/27/20 1110  TempSrc:   PainSc: 0-No pain                 Cecile Hearing

## 2020-06-28 NOTE — Discharge Instructions (Signed)
You were evaluated in the Emergency Department and after careful evaluation, we did not find any emergent condition requiring admission or further testing in the hospital.  You popped open some stitches from your fall.  We replaced here in the emergency department.  Your x-ray shows some bending of the screws from the surgery earlier today.  You will need to follow-up with Dr. Jena Gauss within the next few days for evaluation.  Please return to the Emergency Department if you experience any worsening of your condition.  Thank you for allowing [place] to be a part of your care.

## 2020-06-28 NOTE — ED Notes (Signed)
Wrapped pts right knee where he received suture with gauze and ace bandage. Pt pts leg brace back on. Contacted ortho tech to confirm placement of brace.

## 2020-06-28 NOTE — ED Notes (Signed)
Pt getting loud and aggressive with staff. Pt took picture of rn and badge. Security notified. Pt upset because the wheel chair cannot hold his leg up. RN informed pt that there are no wheel chairs that can hold his leg up. Pt continued to get verbally loud and said "what the fuck am I supposed to do". RN stated this is beyond her control. We only have one type of wheel chair. Pt yelled demanding charge nurse. Charge nurse informed. Pt stated "he was going to sue the hospital and have rn fired".

## 2020-07-24 ENCOUNTER — Ambulatory Visit: Payer: Self-pay | Admitting: Student

## 2020-07-24 DIAGNOSIS — S82001K Unspecified fracture of right patella, subsequent encounter for closed fracture with nonunion: Secondary | ICD-10-CM

## 2020-07-24 NOTE — H&P (Signed)
Orthopaedic Trauma Service (OTS) H&P  Patient ID: Jason [name] MRN: [id number] DOB/AGE: 1985/06/10 35 y.o.  Reason for surgery: Right knee wound dehiscence/drainage  HPI: Jason [name] is an 35 y.o. male presenting for surgery on right knee.  Patient underwent ORIF of right patella nonunion on 06/27/2020.  Was discharged home postoperatively.  Later that evening patient had a fall, landing directly on his right knee resulting in wound dehiscence.  He was seen back in the emergency department and the wound was reinforced with nylon sutures.  Since that time patient has been having drainage from his incision.  He has been on doxycycline for the last 3 weeks with no resolution of the drainage. He denies any fever or chills.  He has been nonweightbearing on the right leg since previous surgery.  He has had his hinged knee brace locked in full extension has not attempted any motion in the brace.  He is now presenting for irrigation debridement of the right knee.    Past Medical History:  Diagnosis Date  . HTN (hypertension) 10/07/2018  . Hypertension     Past Surgical History:  Procedure Laterality Date  . KNEE SURGERY    . LAPAROSCOPIC APPENDECTOMY N/A 01/10/2020   Procedure: APPENDECTOMY LAPAROSCOPIC;  Surgeon: Abigail Miyamoto, MD;  Location: [facility];  Service: General;  Laterality: N/A;  . ORIF PATELLA Right 06/27/2020   Procedure: OPEN REDUCTION INTERNAL (ORIF) FIXATION PATELLA  NONUNION WITH ILIAC CREST MARROW ASPIRATION;  Surgeon: Roby Lofts, MD;  Location: [facility] OR;  Service: Orthopedics;  Laterality: Right;    No family history on file.  Social History:  reports that he has been smoking cigars. He has never used smokeless tobacco. He reports previous alcohol use. He reports previous drug use.  Allergies: No Known Allergies  Medications: I have reviewed the patient's current medications. Prior to Admission: (Not in a hospital admission)   ROS: Constitutional: No fever or  chills Vision: No changes in vision ENT: No difficulty swallowing CV: No chest pain Pulm: No SOB or wheezing GI: No nausea or vomiting GU: No urgency or inability to hold urine Skin:+ Wound dehiscence right knee incision Neurologic: No numbness or tingling Psychiatric: No depression or anxiety Heme: No bruising Allergic: No reaction to medications or food   Exam: There were no vitals taken for this visit. General: NAD Orientation: Alert and oriented x3 Mood and Affect: Mood and affect appropriate.  Pleasant and cooperative Gait: Non-weightbearing RLE with hinge knee brace locked in full extension Coordination and balance: Within normal limits  RLE: Proximal portion of incision with dehiscence and continued serosanguinous drainage. Moderate knee effusion. Tenderness with palpation about knee. No knee motion attempted. Endorses sensation to light touch throughout extremity. Neurovascularly intact  LLE: Skin without lesions. No tenderness to palpation. Full painless ROM, full strength in each muscle groups without evidence of instability.   Medical Decision Making: Data: Imaging: AP and lateral views of the right knee show screws in the patella that have been but stable from previous radiographs.  Labs: No results found for this or any previous visit (from the past 168 hour(s)).    Assessment/Plan: 35 year old male s/p ORIF right patella nonunion 06/27/20 with post-operative wound dehiscence  Patient continues to have drainage from the small wound dehiscence of the proximal portion of his incision despite being on oral antibiotics x3 weeks on outpatient basis.  At this point I feel it is appropriate to proceed with irrigation and debridement of the right knee.  We will plan to take wound cultures intraoperatively.  Risk benefits of procedure discussed with the patient.  He agrees to proceed with surgery.  We will plan to discharge him from the PACU postoperatively but we did discuss  possibility of needing be admitted for IV antibiotics.  He is in agreement with this plan.    Cheng Dec A. Ladonna Snide [facility] [phone] (office) [email or web address]

## 2020-07-24 NOTE — H&P (View-Only) (Signed)
Orthopaedic Trauma Service (OTS) H&P  Patient ID: Jason [name] MRN: [id number] DOB/AGE: 09/18/1985 35 y.o.  Reason for surgery: Right knee wound dehiscence/drainage  HPI: Jason [name] is an 35 y.o. male presenting for surgery on right knee.  Patient underwent ORIF of right patella nonunion on 06/27/2020.  Was discharged home postoperatively.  Later that evening patient had a fall, landing directly on his right knee resulting in wound dehiscence.  He was seen back in the emergency department and the wound was reinforced with nylon sutures.  Since that time patient has been having drainage from his incision.  He has been on doxycycline for the last 3 weeks with no resolution of the drainage. He denies any fever or chills.  He has been nonweightbearing on the right leg since previous surgery.  He has had his hinged knee brace locked in full extension has not attempted any motion in the brace.  He is now presenting for irrigation debridement of the right knee.    Past Medical History:  Diagnosis Date  . HTN (hypertension) 10/07/2018  . Hypertension     Past Surgical History:  Procedure Laterality Date  . KNEE SURGERY    . LAPAROSCOPIC APPENDECTOMY N/A 01/10/2020   Procedure: APPENDECTOMY LAPAROSCOPIC;  Surgeon: Blackman, Douglas, MD;  Location: [facility];  Service: General;  Laterality: N/A;  . ORIF PATELLA Right 06/27/2020   Procedure: OPEN REDUCTION INTERNAL (ORIF) FIXATION PATELLA  NONUNION WITH ILIAC CREST MARROW ASPIRATION;  Surgeon: Haddix, Kevin P, MD;  Location: [facility];  Service: Orthopedics;  Laterality: Right;    No family history on file.  Social History:  reports that he has been smoking cigars. He has never used smokeless tobacco. He reports previous alcohol use. He reports previous drug use.  Allergies: No Known Allergies  Medications: I have reviewed the patient's current medications. Prior to Admission: (Not in a hospital admission)   ROS: Constitutional: No fever or  chills Vision: No changes in vision ENT: No difficulty swallowing CV: No chest pain Pulm: No SOB or wheezing GI: No nausea or vomiting GU: No urgency or inability to hold urine Skin:+ Wound dehiscence right knee incision Neurologic: No numbness or tingling Psychiatric: No depression or anxiety Heme: No bruising Allergic: No reaction to medications or food   Exam: There were no vitals taken for this visit. General: NAD Orientation: Alert and oriented x3 Mood and Affect: Mood and affect appropriate.  Pleasant and cooperative Gait: Non-weightbearing RLE with hinge knee brace locked in full extension Coordination and balance: Within normal limits  RLE: Proximal portion of incision with dehiscence and continued serosanguinous drainage. Moderate knee effusion. Tenderness with palpation about knee. No knee motion attempted. Endorses sensation to light touch throughout extremity. Neurovascularly intact  LLE: Skin without lesions. No tenderness to palpation. Full painless ROM, full strength in each muscle groups without evidence of instability.   Medical Decision Making: Data: Imaging: AP and lateral views of the right knee show screws in the patella that have been but stable from previous radiographs.  Labs: No results found for this or any previous visit (from the past 168 hour(s)).    Assessment/Plan: 35 year old male s/p ORIF right patella nonunion 06/27/20 with post-operative wound dehiscence  Patient continues to have drainage from the small wound dehiscence of the proximal portion of his incision despite being on oral antibiotics x3 weeks on outpatient basis.  At this point I feel it is appropriate to proceed with irrigation and debridement of the right knee.    We will plan to take wound cultures intraoperatively.  Risk benefits of procedure discussed with the patient.  He agrees to proceed with surgery.  We will plan to discharge him from the PACU postoperatively but we did discuss  possibility of needing be admitted for IV antibiotics.  He is in agreement with this plan.    Luci Bellucci A. Ladonna Snide [facility] [phone] (office) [email or web address]

## 2020-07-25 ENCOUNTER — Other Ambulatory Visit: Payer: Self-pay

## 2020-07-25 ENCOUNTER — Other Ambulatory Visit (HOSPITAL_COMMUNITY)
Admission: RE | Admit: 2020-07-25 | Discharge: 2020-07-25 | Disposition: A | Discharge status: Home / Self Care | Payer: BC Managed Care – PPO | Source: Ambulatory Visit | Attending: Student | Admitting: Student

## 2020-07-25 ENCOUNTER — Encounter (HOSPITAL_COMMUNITY): Payer: Self-pay | Admitting: Student

## 2020-07-25 DIAGNOSIS — Z20822 Contact with and (suspected) exposure to covid-19: Secondary | ICD-10-CM | POA: Diagnosis not present

## 2020-07-25 DIAGNOSIS — Z01812 Encounter for preprocedural laboratory examination: Secondary | ICD-10-CM | POA: Insufficient documentation

## 2020-07-25 LAB — SARS CORONAVIRUS 2 (TAT 6-24 HRS): SARS Coronavirus 2: NEGATIVE

## 2020-07-25 NOTE — Progress Notes (Addendum)
PCP - Sherrie Mustache Cardiologist - denies  PPM/ICD - denies   Chest x-ray - n/a EKG - 06/27/20 Stress Test - 12/26/20 ECHO - 11/03/18 Cardiac Cath - denies  CPAP - denies  No Diabetes  Patient instructed to hold all Aspirin, NSAID's, herbal medications, fish oil and vitamins 7 days prior to surgery.   ERAS Protcol - yes until 0430  COVID TEST- 07/25/20 pending  Anesthesia review: yes, broke out in hives with general anesthesia on 06/27/20 (last surgery at [facility])  Patient verbally denies any shortness of breath, fever, cough and chest pain during phone call   -------------  SDW INSTRUCTIONS given:  Your procedure is scheduled on 07/27/20.  Report to [facility] Main Entrance "A" at 05:30 A.M., and check in at the Admitting office.  Call this number if you have problems the morning of surgery:  [phone]   Remember:  Do not eat after midnight the night before your surgery  You may drink clear liquids until 04:30am the morning of your surgery.   Clear liquids allowed are: Water, Non-Citrus Juices (without pulp), Carbonated Beverages, Clear Tea, Black Coffee Only, and Gatorade    Take these medicines the morning of surgery with A SIP OF WATER  acetaminophen (TYLENOL) if needed amLODipine (NORVASC)  atorvastatin (LIPITOR) oxyCODONE valACYclovir (VALTREX)  As of today, STOP taking any Aspirin (unless otherwise instructed by your surgeon) Aleve, Naproxen, Ibuprofen, Motrin, Advil, Goody's, BC's, all herbal medications, fish oil, and all vitamins.                      Do not wear jewelry, make up, or nail polish            Do not wear lotions, powders, colognes, or deodorant.            Men may shave face and neck.            Do not bring valuables to the hospital.            [facility] is not responsible for any belongings or valuables.  Do NOT Smoke (Tobacco/Vaping) or drink Alcohol 24 hours prior to your procedure If you use a CPAP at night, you may bring all  equipment for your overnight stay.   Contacts, glasses, dentures or bridgework may not be worn into surgery.      For patients admitted to the hospital, discharge time will be determined by your treatment team.   Patients discharged the day of surgery will not be allowed to drive home, and someone needs to stay with them for 24 hours.    Special instructions:   [place]- Preparing For Surgery  Before surgery, you can play an important role. Because skin is not sterile, your skin needs to be as free of germs as possible. You can reduce the number of germs on your skin by washing with CHG (chlorahexidine gluconate) Soap before surgery.  CHG is an antiseptic cleaner which kills germs and bonds with the skin to continue killing germs even after washing.    Oral Hygiene is also important to reduce your risk of infection.  Remember - BRUSH YOUR TEETH THE MORNING OF SURGERY WITH YOUR REGULAR TOOTHPASTE  Please do not use if you have an allergy to CHG or antibacterial soaps. If your skin becomes reddened/irritated stop using the CHG.  Do not shave (including legs and underarms) for at least 48 hours prior to first CHG shower. It is OK to shave your  face.  Please follow these instructions carefully.   1. Shower the NIGHT BEFORE SURGERY and the MORNING OF SURGERY with DIAL Soap.   2. Pat yourself dry with a CLEAN TOWEL.  3. Wear CLEAN PAJAMAS to bed the night before surgery  4. Place CLEAN SHEETS on your bed the night of your first shower and DO NOT SLEEP WITH PETS.   Day of Surgery: Please shower morning of surgery  Wear Clean/Comfortable clothing the morning of surgery Do not apply any deodorants/lotions.   Remember to brush your teeth WITH YOUR REGULAR TOOTHPASTE.   Questions were answered. Patient verbalized understanding of instructions.

## 2020-07-26 NOTE — Anesthesia Preprocedure Evaluation (Addendum)
Anesthesia Evaluation  Patient identified by MRN, date of birth, ID band Patient awake    Reviewed: Allergy & Precautions, H&P , NPO status , Patient's Chart, lab work & pertinent test results  Airway Mallampati: II   Neck ROM: full    Dental   Pulmonary Patient abstained from smoking., former smoker,    breath sounds clear to auscultation       Cardiovascular hypertension,  Rhythm:regular Rate:Normal     Neuro/Psych    GI/Hepatic   Endo/Other    Renal/GU      Musculoskeletal   Abdominal   Peds  Hematology   Anesthesia Other Findings   Reproductive/Obstetrics                            Anesthesia Physical Anesthesia Plan  ASA: II  Anesthesia Plan: General   Post-op Pain Management:    Induction: Intravenous  PONV Risk Score and Plan: 2 and Ondansetron, Dexamethasone, Midazolam and Treatment may vary due to age or medical condition  Airway Management Planned: LMA  Additional Equipment:   Intra-op Plan:   Post-operative Plan: Extubation in OR  Informed Consent: I have reviewed the patients History and Physical, chart, labs and discussed the procedure including the risks, benefits and alternatives for the proposed anesthesia with the patient or authorized representative who has indicated his/her understanding and acceptance.     Dental advisory given  Plan Discussed with: Anesthesiologist, Surgeon and CRNA  Anesthesia Plan Comments: (PAT note written 07/26/2020 by Shonna Chock, PA-C.  He reported hives after 06/27/20 surgery. Anesthesia Postprocedure notes do not mention hives or rash. Hives or rash are not noted in 06/2020 ED note later that same evening (ED provider documented "no rash"). Unclear what my have specifically contributed. By review of medications given, he received cefazolin, Versed, fentanyl, lidocaine, propofol, rocuronium, dexamethasone, ondansetron, sugammadex,  bupivacaine, clonidine, dexmedetomidine, phenylephrine, ephedrine sulfate, and was discharged home on oxycodone.  He received Zosyn (piperacillin/tazobactam) for appendicitis 01/10/20.  )       Anesthesia Quick Evaluation

## 2020-07-26 NOTE — Progress Notes (Signed)
Anesthesia Chart Review: SAME DAY WORK-UP   Case: [id number] Date/Time: 07/27/20 0715   Procedure: IRRIGATION AND DEBRIDEMENT KNEE (Right Knee)   Anesthesia type: General   Diagnosis: 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, unspecified fracture morphology, subsequent encounter [S82.001K]   Pre-op diagnosis: Draining wound   [facility] RO[facility] / [facility]   Surgeons: Roby Lofts, MD      DISCUSSION: Patient is a 35 year old Jason [name] scheduled for the above procedure. He is s/p ORIF right patella nonunion (from fall from ladder in July 2021) 06/27/20. He was discharged home, but that night while getting in bed the brace bent (thought it was locked) and when knee flexed he had wound dehiscence. Wound was reinforced in the ED to nylon sutures. Since that time he has had wound drainage despite 3 week course of doxycycline. Intraoperative I&D with wound cultures recommended. By H&P, anticipated discharge from PACU but still with possibility of need to admit with IV antibiotics.  History includes former smoker, HTN, appendectomy 01/10/20, ORIF right patella 06/27/20.  He reported some hives after 06/27/20 surgery--unclear what specifically may have contributed. Notes suggest he received Ancef and Vancomycin, but by medication history, cefazolin is listed as completed and vancomycin as discontinued or active. Intraoperatively, he also received Versed, fentanyl, lidocaine, propofol, rocuronium, dexamethasone, ondansetron, sugammadex, bupivacaine, clonidine, dexmedetomidine, phenylephrine, ephedrine sulfate, and was discharged home on oxycodone. Anesthesia Postprocedure notes do not mention hives or rash. Hives or rash are not noted in 06/2020 ED note either (ED provider documented "no rash").  He received Zosyn (piperacillin/tazobactam) for appendicitis 01/10/20. By medication list, he is currently on Percocet 10/325 mg as needed for pain.   07/25/20 COVID-19 test negative. Anesthesia team to  evaluate on the day of surgery.   VS: Ht 6\' 1"  (1.854 m)   Wt 106.6 kg   BMI 31.00 kg/m   BP Readings from Last 3 Encounters:  06/28/20 132/81  06/27/20 126/88  01/11/20 118/81    PROVIDERS: 13/03/21, NP is listed as PCP. Per PAT RN documentation, PCP is Stevphen Rochester, PA-C with [facility] [place]. - He had cardiology evaluation at [facility] in 2020 for chest pain. Last visit 01/04/19 with 01/06/19, NP. ETT and echo were overall unremarkable. Medications added for treatment of HTN and HLD. He was also encouraged to "remain abstinent from cocaine and cigar use.  Also encouraged him to cut back on alcohol use."   LABS: For day of surgery. As of 06/27/20, normal CBC, Cr 1.10, glucose 105.    IMAGES: Xray right knee 06/28/20: IMPRESSION: Changes suggestive of interval trauma with bending of the fixation screws in the patella as well as increased soft tissue swelling anteriorly. Correlate with clinical history.    EKG: 06/27/20: NSR   CV: Exercise treadmill stress test 12/27/2018: Exercise treadmill stress test performed using Bruce protocol.  Patient reached 13.4 METS, and 99% of age predicted maximum heart rate.  Exercise capacity was excellent. No chest pain reported.  Normal heart rate and hemodynamic response. Peak ECG demonstrated sinus tachycardia.  ST-T wave abnormalities associated with RBBB. No ischemic changes seen. Low risk treadmill stress test.  Echocardiogram 11/04/2018:  Left ventricle cavity is normal in size. Mild concentric hypertrophy of  the left ventricle. Normal LV systolic function with EF 58%. Normal global  wall motion. Normal diastolic filling pattern. Calculated EF 58%.  Mild tricuspid regurgitation.  No evidence of pulmonary hypertension.   Past Medical History:  Diagnosis Date  . Complication of anesthesia    broke out in hives from general anesthesia on 06/27/20  . HTN (hypertension) 10/07/2018  . Hypertension      Past Surgical History:  Procedure Laterality Date  . APPENDECTOMY    . KNEE SURGERY    . LAPAROSCOPIC APPENDECTOMY N/A 01/10/2020   Procedure: APPENDECTOMY LAPAROSCOPIC;  Surgeon: Abigail Miyamoto, MD;  Location: [facility];  Service: General;  Laterality: N/A;  . ORIF PATELLA Right 06/27/2020   Procedure: OPEN REDUCTION INTERNAL (ORIF) FIXATION PATELLA  NONUNION WITH ILIAC CREST MARROW ASPIRATION;  Surgeon: Roby Lofts, MD;  Location: [facility];  Service: Orthopedics;  Laterality: Right;    MEDICATIONS: No current facility-administered medications for this encounter.   Marland Kitchen acetaminophen (TYLENOL) 500 MG tablet  . amLODipine (NORVASC) 10 MG tablet  . atorvastatin (LIPITOR) 80 MG tablet  . lisinopril-hydrochlorothiazide (ZESTORETIC) 20-25 MG tablet  . oxyCODONE-acetaminophen (PERCOCET) 10-325 MG tablet  . valACYclovir (VALTREX) 500 MG tablet  . aspirin EC 81 MG tablet  . oxyCODONE 10 MG TABS    Shonna Chock, PA-C Surgical Short Stay/Anesthesiology [facility] Phone [phone] [facility] Phone [phone] 07/26/2020 11:26 AM

## 2020-07-27 ENCOUNTER — Ambulatory Visit (HOSPITAL_COMMUNITY): Payer: Worker's Compensation | Admitting: Vascular Surgery

## 2020-07-27 ENCOUNTER — Ambulatory Visit (HOSPITAL_COMMUNITY)
Admission: RE | Admit: 2020-07-27 | Discharge: 2020-07-27 | Disposition: A | Discharge status: Home / Self Care | Payer: Worker's Compensation | Attending: Student | Admitting: Student

## 2020-07-27 ENCOUNTER — Encounter (HOSPITAL_COMMUNITY): Payer: Self-pay | Admitting: Student

## 2020-07-27 ENCOUNTER — Encounter (HOSPITAL_COMMUNITY)
Admission: RE | Disposition: A | Discharge status: Home / Self Care | Payer: Self-pay | Source: Home / Self Care | Attending: Student

## 2020-07-27 ENCOUNTER — Ambulatory Visit (HOSPITAL_COMMUNITY): Payer: BC Managed Care – PPO

## 2020-07-27 DIAGNOSIS — Y831 Surgical operation with implant of artificial internal device as the cause of abnormal reaction of the patient, or of later complication, without mention of misadventure at the time of the procedure: Secondary | ICD-10-CM | POA: Insufficient documentation

## 2020-07-27 DIAGNOSIS — W11XXXD Fall on and from ladder, subsequent encounter: Secondary | ICD-10-CM | POA: Diagnosis not present

## 2020-07-27 DIAGNOSIS — T8149XA Infection following a procedure, other surgical site, initial encounter: Secondary | ICD-10-CM | POA: Diagnosis present

## 2020-07-27 DIAGNOSIS — S82001K Unspecified fracture of right patella, subsequent encounter for closed fracture with nonunion: Secondary | ICD-10-CM | POA: Insufficient documentation

## 2020-07-27 DIAGNOSIS — W19XXXA Unspecified fall, initial encounter: Secondary | ICD-10-CM | POA: Diagnosis not present

## 2020-07-27 DIAGNOSIS — Z79891 Long term (current) use of opiate analgesic: Secondary | ICD-10-CM | POA: Diagnosis not present

## 2020-07-27 DIAGNOSIS — F17290 Nicotine dependence, other tobacco product, uncomplicated: Secondary | ICD-10-CM | POA: Insufficient documentation

## 2020-07-27 DIAGNOSIS — Z7982 Long term (current) use of aspirin: Secondary | ICD-10-CM | POA: Diagnosis not present

## 2020-07-27 DIAGNOSIS — T8142XA Infection following a procedure, deep incisional surgical site, initial encounter: Secondary | ICD-10-CM | POA: Insufficient documentation

## 2020-07-27 DIAGNOSIS — Z79899 Other long term (current) drug therapy: Secondary | ICD-10-CM | POA: Diagnosis not present

## 2020-07-27 DIAGNOSIS — Z419 Encounter for procedure for purposes other than remedying health state, unspecified: Secondary | ICD-10-CM

## 2020-07-27 DIAGNOSIS — T8133XA Disruption of traumatic injury wound repair, initial encounter: Secondary | ICD-10-CM | POA: Diagnosis not present

## 2020-07-27 HISTORY — PX: IRRIGATION AND DEBRIDEMENT KNEE: SHX5185

## 2020-07-27 HISTORY — DX: Other complications of anesthesia, initial encounter: T88.59XA

## 2020-07-27 LAB — CBC
HCT: 47.5 % (ref 39.0–52.0)
Hemoglobin: 15 g/dL (ref 13.0–17.0)
MCH: 26.8 pg (ref 26.0–34.0)
MCHC: 31.6 g/dL (ref 30.0–36.0)
MCV: 85 fL (ref 80.0–100.0)
Platelets: 299 10*3/uL (ref 150–400)
RBC: 5.59 MIL/uL (ref 4.22–5.81)
RDW: 12.7 % (ref 11.5–15.5)
WBC: 4.3 10*3/uL (ref 4.0–10.5)
nRBC: 0 % (ref 0.0–0.2)

## 2020-07-27 LAB — BASIC METABOLIC PANEL
Anion gap: 7 (ref 5–15)
BUN: 13 mg/dL (ref 6–20)
CO2: 28 mmol/L (ref 22–32)
Calcium: 9.7 mg/dL (ref 8.9–10.3)
Chloride: 100 mmol/L (ref 98–111)
Creatinine, Ser: 1.18 mg/dL (ref 0.61–1.24)
GFR, Estimated: >60 mL/min (ref >60)
Glucose, Bld: 102 mg/dL — ABNORMAL HIGH (ref 70–99)
Potassium: 3.6 mmol/L (ref 3.5–5.1)
Sodium: 135 mmol/L (ref 135–145)

## 2020-07-27 SURGERY — IRRIGATION AND DEBRIDEMENT KNEE
Anesthesia: General | Site: Knee | Laterality: Right

## 2020-07-27 MED ORDER — SUFENTANIL CITRATE 50 MCG/ML IV SOLN
INTRAVENOUS | Status: DC | PRN
  Administered 2020-07-27: 10 ug via INTRAVENOUS

## 2020-07-27 MED ORDER — OXYCODONE HCL 5 MG/5ML PO SOLN: 5.0000 mg | Freq: Once | ORAL | Status: DC | PRN

## 2020-07-27 MED ORDER — DEXAMETHASONE SODIUM PHOSPHATE 10 MG/ML IJ SOLN
INTRAMUSCULAR | Status: DC | PRN
  Administered 2020-07-27: 10 mg via INTRAVENOUS

## 2020-07-27 MED ORDER — PROPOFOL 10 MG/ML IV BOLUS
INTRAVENOUS | Status: AC
  Filled 2020-07-27: qty 20

## 2020-07-27 MED ORDER — FENTANYL CITRATE (PF) 100 MCG/2ML IJ SOLN
INTRAMUSCULAR | Status: AC
  Filled 2020-07-27: qty 2

## 2020-07-27 MED ORDER — CHLORHEXIDINE GLUCONATE 0.12 % MT SOLN
OROMUCOSAL | Status: AC
  Administered 2020-07-27: 15 mL via OROMUCOSAL
  Filled 2020-07-27: qty 15

## 2020-07-27 MED ORDER — HYDROMORPHONE HCL 1 MG/ML IJ SOLN
INTRAMUSCULAR | Status: AC
  Filled 2020-07-27: qty 1

## 2020-07-27 MED ORDER — 0.9 % SODIUM CHLORIDE (POUR BTL) OPTIME
TOPICAL | Status: DC | PRN
  Administered 2020-07-27: 1000 mL

## 2020-07-27 MED ORDER — VANCOMYCIN HCL 1000 MG IV SOLR
INTRAVENOUS | Status: AC
  Filled 2020-07-27: qty 1000

## 2020-07-27 MED ORDER — DOXYCYCLINE MONOHYDRATE 100 MG PO CAPS: 100.0000 mg | ORAL_CAPSULE | Freq: Two times a day (BID) | ORAL | 0 refills | Status: AC

## 2020-07-27 MED ORDER — LACTATED RINGERS IV SOLN: INTRAVENOUS | Status: DC

## 2020-07-27 MED ORDER — TOBRAMYCIN SULFATE 1.2 G IJ SOLR
INTRAMUSCULAR | Status: DC | PRN
  Administered 2020-07-27: 1.2 g

## 2020-07-27 MED ORDER — VANCOMYCIN HCL 1000 MG IV SOLR
INTRAVENOUS | Status: DC | PRN
  Administered 2020-07-27: 1000 mg

## 2020-07-27 MED ORDER — BACITRACIN ZINC 500 UNIT/GM EX OINT
TOPICAL_OINTMENT | CUTANEOUS | Status: AC
  Filled 2020-07-27: qty 28.35

## 2020-07-27 MED ORDER — FENTANYL CITRATE (PF) 100 MCG/2ML IJ SOLN
25.0000 ug | INTRAMUSCULAR | Status: DC | PRN
  Administered 2020-07-27 (×3): 50 ug via INTRAVENOUS

## 2020-07-27 MED ORDER — LIDOCAINE HCL (CARDIAC) PF 100 MG/5ML IV SOSY
PREFILLED_SYRINGE | INTRAVENOUS | Status: DC | PRN
  Administered 2020-07-27: 100 mg via INTRAVENOUS

## 2020-07-27 MED ORDER — HYDROMORPHONE HCL 1 MG/ML IJ SOLN
0.2500 mg | INTRAMUSCULAR | Status: DC | PRN
  Administered 2020-07-27: 0.5 mg via INTRAVENOUS

## 2020-07-27 MED ORDER — ONDANSETRON HCL 4 MG/2ML IJ SOLN: 4.0000 mg | Freq: Four times a day (QID) | INTRAMUSCULAR | Status: DC | PRN

## 2020-07-27 MED ORDER — OXYCODONE-ACETAMINOPHEN 10-325 MG PO TABS: 1.0000 | ORAL_TABLET | ORAL | 0 refills | Status: DC | PRN

## 2020-07-27 MED ORDER — ONDANSETRON HCL 4 MG/2ML IJ SOLN
INTRAMUSCULAR | Status: DC | PRN
  Administered 2020-07-27: 4 mg via INTRAVENOUS

## 2020-07-27 MED ORDER — TOBRAMYCIN SULFATE 1.2 G IJ SOLR
INTRAMUSCULAR | Status: AC
  Filled 2020-07-27: qty 1.2

## 2020-07-27 MED ORDER — LACTATED RINGERS IV SOLN: INTRAVENOUS | Status: DC | PRN

## 2020-07-27 MED ORDER — LEVOFLOXACIN 750 MG PO TABS: 750.0000 mg | ORAL_TABLET | Freq: Every day | ORAL | 0 refills | Status: AC

## 2020-07-27 MED ORDER — CHLORHEXIDINE GLUCONATE 0.12 % MT SOLN: 15.0000 mL | Freq: Once | OROMUCOSAL | Status: AC

## 2020-07-27 MED ORDER — BUPIVACAINE-EPINEPHRINE (PF) 0.5% -1:200000 IJ SOLN
INTRAMUSCULAR | Status: DC | PRN
  Administered 2020-07-27: 30 mL via PERINEURAL

## 2020-07-27 MED ORDER — ORAL CARE MOUTH RINSE: 15.0000 mL | Freq: Once | OROMUCOSAL | Status: AC

## 2020-07-27 MED ORDER — GLYCOPYRROLATE PF 0.2 MG/ML IJ SOSY
PREFILLED_SYRINGE | INTRAMUSCULAR | Status: DC | PRN
  Administered 2020-07-27: .2 mg via INTRAVENOUS

## 2020-07-27 MED ORDER — SODIUM CHLORIDE 0.9 % IR SOLN
Status: DC | PRN
  Administered 2020-07-27: 3000 mL

## 2020-07-27 MED ORDER — SUFENTANIL CITRATE 50 MCG/ML IV SOLN
INTRAVENOUS | Status: AC
  Filled 2020-07-27: qty 1

## 2020-07-27 MED ORDER — MIDAZOLAM HCL 2 MG/2ML IJ SOLN
INTRAMUSCULAR | Status: DC | PRN
  Administered 2020-07-27: 2 mg via INTRAVENOUS

## 2020-07-27 MED ORDER — PROPOFOL 10 MG/ML IV BOLUS
INTRAVENOUS | Status: DC | PRN
  Administered 2020-07-27: 300 mg via INTRAVENOUS

## 2020-07-27 MED ORDER — MIDAZOLAM HCL 2 MG/2ML IJ SOLN
INTRAMUSCULAR | Status: AC
  Filled 2020-07-27: qty 2

## 2020-07-27 MED ORDER — OXYCODONE HCL 5 MG PO TABS: 5.0000 mg | ORAL_TABLET | Freq: Once | ORAL | Status: DC | PRN

## 2020-07-27 MED ORDER — VANCOMYCIN HCL IN DEXTROSE 1-5 GM/200ML-% IV SOLN
INTRAVENOUS | Status: AC
  Filled 2020-07-27: qty 200

## 2020-07-27 MED ORDER — DEXMEDETOMIDINE (PRECEDEX) IN NS 20 MCG/5ML (4 MCG/ML) IV SYRINGE
PREFILLED_SYRINGE | INTRAVENOUS | Status: DC | PRN
  Administered 2020-07-27: 20 ug via INTRAVENOUS

## 2020-07-27 MED ORDER — VANCOMYCIN HCL 1000 MG IV SOLR
INTRAVENOUS | Status: DC | PRN
  Administered 2020-07-27: 1000 mg via INTRAVENOUS

## 2020-07-27 SURGICAL SUPPLY — 46 items
BNDG COHESIVE 4X5 TAN STRL (GAUZE/BANDAGES/DRESSINGS) IMPLANT
BNDG ELASTIC 6X10 VLCR STRL LF (GAUZE/BANDAGES/DRESSINGS) ×2 IMPLANT
BNDG GAUZE ELAST 4 BULKY (GAUZE/BANDAGES/DRESSINGS) IMPLANT
BRUSH SCRUB EZ PLAIN DRY (MISCELLANEOUS) ×4 IMPLANT
CHLORAPREP W/TINT 26 (MISCELLANEOUS) ×2 IMPLANT
COVER MAYO STAND STRL (DRAPES) ×2 IMPLANT
COVER SURGICAL LIGHT HANDLE (MISCELLANEOUS) ×4 IMPLANT
COVER WAND RF STERILE (DRAPES) ×2 IMPLANT
DRAPE ORTHO SPLIT 77X108 STRL (DRAPES) ×1
DRAPE SURG 17X23 STRL (DRAPES) ×2 IMPLANT
DRAPE SURG ORHT 6 SPLT 77X108 (DRAPES) ×1 IMPLANT
DRAPE U-SHAPE 47X51 STRL (DRAPES) ×2 IMPLANT
DRSG ADAPTIC 3X8 NADH LF (GAUZE/BANDAGES/DRESSINGS) ×2 IMPLANT
ELECT REM PT RETURN 9FT ADLT (ELECTROSURGICAL)
ELECTRODE REM PT RTRN 9FT ADLT (ELECTROSURGICAL) IMPLANT
EVACUATOR 1/8 PVC DRAIN (DRAIN) IMPLANT
GAUZE SPONGE 4X4 12PLY STRL (GAUZE/BANDAGES/DRESSINGS) ×2 IMPLANT
GLOVE BIO SURGEON STRL SZ 6.5 (GLOVE) ×6 IMPLANT
GLOVE BIO SURGEON STRL SZ7.5 (GLOVE) ×8 IMPLANT
GLOVE BIOGEL PI IND STRL 7.5 (GLOVE) ×1 IMPLANT
GLOVE BIOGEL PI INDICATOR 7.5 (GLOVE) ×1
GLOVE SURG UNDER POLY LF SZ6.5 (GLOVE) ×2 IMPLANT
GOWN STRL REUS W/ TWL LRG LVL3 (GOWN DISPOSABLE) ×2 IMPLANT
GOWN STRL REUS W/TWL LRG LVL3 (GOWN DISPOSABLE) ×2
HANDPIECE INTERPULSE COAX TIP (DISPOSABLE)
KIT BASIN OR (CUSTOM PROCEDURE TRAY) ×2 IMPLANT
KIT TURNOVER KIT B (KITS) ×2 IMPLANT
MANIFOLD NEPTUNE II (INSTRUMENTS) ×2 IMPLANT
NS IRRIG 1000ML POUR BTL (IV SOLUTION) ×2 IMPLANT
PACK ORTHO EXTREMITY (CUSTOM PROCEDURE TRAY) ×2 IMPLANT
PAD ABD 8X10 STRL (GAUZE/BANDAGES/DRESSINGS) ×2 IMPLANT
PAD ARMBOARD 7.5X6 YLW CONV (MISCELLANEOUS) ×4 IMPLANT
PADDING CAST COTTON 6X4 STRL (CAST SUPPLIES) IMPLANT
SET HNDPC FAN SPRY TIP SCT (DISPOSABLE) IMPLANT
SPONGE LAP 18X18 RF (DISPOSABLE) ×2 IMPLANT
SUT ETHILON 2 0 FS 18 (SUTURE) ×2 IMPLANT
SUT ETHILON 3 0 PS 1 (SUTURE) ×2 IMPLANT
SUT MON AB 2-0 CT1 36 (SUTURE) ×2 IMPLANT
SUT PDS AB 0 CT 36 (SUTURE) IMPLANT
SWAB CULTURE ESWAB REG 1ML (MISCELLANEOUS) ×2 IMPLANT
TOWEL GREEN STERILE (TOWEL DISPOSABLE) ×4 IMPLANT
TOWEL GREEN STERILE FF (TOWEL DISPOSABLE) ×2 IMPLANT
TUBE CONNECTING 12X1/4 (SUCTIONS) ×2 IMPLANT
UNDERPAD 30X36 HEAVY ABSORB (UNDERPADS AND DIAPERS) ×2 IMPLANT
WATER STERILE IRR 1000ML POUR (IV SOLUTION) ×2 IMPLANT
YANKAUER SUCT BULB TIP NO VENT (SUCTIONS) ×2 IMPLANT

## 2020-07-27 NOTE — Interval H&P Note (Signed)
History and Physical Interval Note:  07/27/2020 7:14 AM  Jason [name]  has presented today for surgery, with the diagnosis of Draining wound.  The various methods of treatment have been discussed with the patient and family. After consideration of risks, benefits and other options for treatment, the patient has consented to  Procedure(s): IRRIGATION AND DEBRIDEMENT KNEE (Right) as a surgical intervention.  The patient's history has been reviewed, patient examined, no change in status, stable for surgery.  I have reviewed the patient's chart and labs.  Questions were answered to the patient's satisfaction.     Caryn Bee P Dajanee Voorheis

## 2020-07-27 NOTE — Discharge Instructions (Signed)
Weight bearing right lower extremity with knee brace locked in full extension. Call the office for an appointment in 2 weeks. [phone]

## 2020-07-27 NOTE — Anesthesia Postprocedure Evaluation (Signed)
Anesthesia Post Note  Patient: Jason [name]  Procedure(s) Performed: IRRIGATION AND DEBRIDEMENT KNEE (Right Knee)     Patient location during evaluation: PACU Anesthesia Type: General Level of consciousness: awake and alert Pain management: pain level controlled Vital Signs Assessment: post-procedure vital signs reviewed and stable Respiratory status: spontaneous breathing, nonlabored ventilation, respiratory function stable and patient connected to nasal cannula oxygen Cardiovascular status: blood pressure returned to baseline and stable Postop Assessment: no apparent nausea or vomiting Anesthetic complications: no Comments: Femoral nerve block performed for pain control   No complications documented.  Last Vitals:  Vitals:   07/27/20 0935 07/27/20 0951  BP: (!) 136/92 105/76  Pulse: 82 84  Resp: 18 17  Temp:  (!) 36.2 C  SpO2: 97% 100%    Last Pain:  Vitals:   07/27/20 0935  TempSrc:   PainSc: 10-Worst pain ever                 Alanda Colton S

## 2020-07-27 NOTE — Anesthesia Procedure Notes (Addendum)
Anesthesia Regional Block: Femoral nerve block   Pre-Anesthetic Checklist: ,, timeout performed, Correct Patient, Correct Site, Correct Laterality, Correct Procedure,, site marked, risks and benefits discussed, Surgical consent,  Pre-op evaluation,  At surgeon's request and post-op pain management  Laterality: Right  Prep: chloraprep       Needles:  Injection technique: Single-shot  Needle Type: Echogenic Stimulator Needle     Needle Length: 9cm  Needle Gauge: 21     Additional Needles:   Procedures:, nerve stimulator,,,, intact distal pulses,,,   Nerve Stimulator or Paresthesia:  Response: Quadriceps muscle contraction, 0.45 mA,   Additional Responses:   Narrative:  Start time: 07/27/2020 9:50 AM End time: 07/27/2020 10:00 AM Injection made incrementally with aspirations every 5 mL.  Performed by: Personally  Anesthesiologist: Achille Rich, MD  Additional Notes: Functioning IV was confirmed and monitors were applied.  A 35mm 21ga Arrow echogenic stimulator needle was used. Sterile prep and drape,hand hygiene and sterile gloves were used.  Negative aspiration and negative test dose prior to incremental administration of local anesthetic. The patient tolerated the procedure well.

## 2020-07-27 NOTE — Op Note (Signed)
Orthopaedic Surgery Operative Note (CSN: [id number] ) Date of Surgery: 07/27/2020  Admit Date: 07/27/2020   Diagnoses: Pre-Op Diagnoses: Wound drainage right knee Right patellar nonunion  Post-Op Diagnosis: Same  Procedures: CPT 10180-Irrigation and debridement of right knee postoperative infection  Surgeons : Primary: Roby Lofts, MD  Assistant: Ulyses Southward, PA-C  Location: OR 3   Anesthesia:General  Antibiotics: Vancomycin 1gm given after cultures taken. Topical vancomycin powder and topical tobramycin powder   Tourniquet time:None  Estimated Blood Loss:3 mL  Complications:None   Specimens: ID Type Source Tests Collected by Time Destination  A : Right knee tissue Tissue Wound AEROBIC/ANAEROBIC CULTURE W GRAM STAIN (SURGICAL/DEEP WOUND) Roby Lofts, MD 07/27/2020 [phone]      Implants: * No implants in log *   Indications for Surgery: 35 year old male who had a patellar nonunion that underwent repair on 06/27/2020.  That evening he subsequently had his brace on and unlocked causing him to fall and he landed on his wound dehisced his proximal incision.  He went to the emergency room where sutures were placed however he has had persistent drainage.  He has a failed outpatient antibiotics and due to the continued drainage I recommended proceeding with irrigation and debridement.  Risks and benefits were discussed with patient.  Risks including but not limited to persistent infection, knee stiffness, knee arthritis, persistent nonunion, nerve and blood vessel injury, even the possibility anesthetic complications.  The patient agreed to proceed with surgery and consent was obtained.  Operative Findings: 1.  Persistent drainage of proximal incision that tracked down to the nonunion site no gross purulence was noted. 2.  Cultures taken and sent to the lab from soft tissue around the wound and nonunion site  Procedure: The patient was identified in the preoperative holding  area. Consent was confirmed with the patient and their family and all questions were answered. The operative extremity was marked after confirmation with the patient. he was then brought back to the operating room by our anesthesia colleagues.  He was carefully transferred over to a radiolucent flat top table.  He was placed under general anesthetic.  The right lower extremity was then prepped and draped in usual sterile fashion.  A timeout was performed to verify the patient, the procedure, and the extremity.  Preoperative antibiotics were held until cultures were obtained.  The proximal incision had dehisced I reincised the incision, encountered dehiscence of the underlying fascial layers as well.  The dehiscence had probed all the way down to the nonunion site.  I encountered some of the bone graft putty that was present and remove this.  I took cultures of the soft tissue and drainage.  There is no gross purulence on exam and I proceeded to remove all of the nonviable tissue.  I then used low pressure pulsatile lavage to thoroughly irrigate the wound with approximately 3 L of normal saline.  The nonunion site was still well fixed without any motion at the site.  A gram of vancomycin powder and 1.2 g of tobramycin powder were placed into the wound.  The wound was then closed with 2-0 Monocryl in layered fashion and then 3-0 nylon.  Sterile dressing was placed.  Patient was then awoke from anesthesia and taken to the PACU stable condition.   Debridement type: Excisional Debridement  Side: right  Body Location: Knee  Tools used for debridement: scalpel, curette and rongeur  Pre-debridement Wound size (cm):  N/A  Post-debridement Wound size (cm):  N/A  Debridement  depth beyond dead/damaged tissue down to healthy viable tissue: yes  Tissue layer involved: skin, subcutaneous tissue, muscle / fascia, bone  Nature of tissue removed: Devitalized Tissue  Irrigation volume: 3L     Irrigation fluid  type: Normal Saline   Post Op Plan/Instructions: Patient will be weightbearing right lower extremity his knee brace locked in full extension.  We will plan to send him home on doxycycline and Levaquin and follow-up cultures of the neck 2 days to narrow the coverage.  Plan to see him back in approximately 2 weeks for wound check and likely start some range of motion at that time.  I was present and performed the entire surgery.  Ulyses Southward, PA-C did assist me throughout the case. An assistant was necessary given the difficulty in approach, maintenance of reduction and ability to instrument the fracture.   Truitt Merle, MD [facility]

## 2020-07-27 NOTE — Anesthesia Procedure Notes (Signed)
Procedure Name: LMA Insertion Date/Time: 07/27/2020 7:35 AM Performed by: Melina Schools, CRNA Pre-anesthesia Checklist: Patient identified, Emergency Drugs available, Suction available, Patient being monitored and Timeout performed Patient Re-evaluated:Patient Re-evaluated prior to induction Oxygen Delivery Method: Circle system utilized Preoxygenation: Pre-oxygenation with 100% oxygen Induction Type: IV induction Ventilation: Mask ventilation without difficulty LMA: LMA inserted LMA Size: 4.0 Number of attempts: 1 Placement Confirmation: positive ETCO2 and breath sounds checked- equal and bilateral Dental Injury: Teeth and Oropharynx as per pre-operative assessment

## 2020-07-27 NOTE — Transfer of Care (Signed)
Immediate Anesthesia Transfer of Care Note  Patient: Jason [name]  Procedure(s) Performed: IRRIGATION AND DEBRIDEMENT KNEE (Right Knee)  Patient Location: PACU  Anesthesia Type:General  Level of Consciousness: awake, drowsy, patient cooperative and responds to stimulation  Airway & Oxygen Therapy: Patient Spontanous Breathing and Patient connected to nasal cannula oxygen  Post-op Assessment: Report given to RN, Post -op Vital signs reviewed and stable and Patient moving all extremities X 4  Post vital signs: Reviewed and stable  Last Vitals:  Vitals Value Taken Time  BP 126/105 07/27/20 0836  Temp    Pulse 86 07/27/20 0842  Resp 17 07/27/20 0842  SpO2 99 % 07/27/20 0842  Vitals shown include unvalidated device data.  Last Pain:  Vitals:   07/27/20 0611  TempSrc:   PainSc: 10-Worst pain ever      Patients Stated Pain Goal: 4 (07/27/20 3748)  Complications: No complications documented.

## 2020-07-28 ENCOUNTER — Encounter (HOSPITAL_COMMUNITY): Payer: Self-pay | Admitting: Student

## 2020-08-01 LAB — AEROBIC/ANAEROBIC CULTURE W GRAM STAIN (SURGICAL/DEEP WOUND): Culture: NORMAL

## 2020-09-21 ENCOUNTER — Encounter (HOSPITAL_COMMUNITY): Payer: Self-pay

## 2020-09-21 ENCOUNTER — Other Ambulatory Visit: Payer: Self-pay

## 2020-09-21 ENCOUNTER — Emergency Department (HOSPITAL_COMMUNITY): Payer: BC Managed Care – PPO

## 2020-09-21 ENCOUNTER — Emergency Department (HOSPITAL_COMMUNITY)
Admission: EM | Admit: 2020-09-21 | Discharge: 2020-09-21 | Disposition: A | Discharge status: Home / Self Care | Payer: BC Managed Care – PPO | Attending: Emergency Medicine | Admitting: Emergency Medicine

## 2020-09-21 DIAGNOSIS — M79602 Pain in left arm: Secondary | ICD-10-CM | POA: Diagnosis not present

## 2020-09-21 DIAGNOSIS — M79605 Pain in left leg: Secondary | ICD-10-CM | POA: Insufficient documentation

## 2020-09-21 DIAGNOSIS — R0789 Other chest pain: Secondary | ICD-10-CM | POA: Insufficient documentation

## 2020-09-21 DIAGNOSIS — R42 Dizziness and giddiness: Secondary | ICD-10-CM | POA: Diagnosis not present

## 2020-09-21 DIAGNOSIS — Z79899 Other long term (current) drug therapy: Secondary | ICD-10-CM | POA: Insufficient documentation

## 2020-09-21 DIAGNOSIS — R202 Paresthesia of skin: Secondary | ICD-10-CM | POA: Diagnosis not present

## 2020-09-21 DIAGNOSIS — I10 Essential (primary) hypertension: Secondary | ICD-10-CM | POA: Diagnosis not present

## 2020-09-21 DIAGNOSIS — Z87891 Personal history of nicotine dependence: Secondary | ICD-10-CM | POA: Diagnosis not present

## 2020-09-21 DIAGNOSIS — R079 Chest pain, unspecified: Secondary | ICD-10-CM | POA: Diagnosis present

## 2020-09-21 LAB — CBC
HCT: 49 % (ref 39.0–52.0)
Hemoglobin: 15.7 g/dL (ref 13.0–17.0)
MCH: 26.8 pg (ref 26.0–34.0)
MCHC: 32 g/dL (ref 30.0–36.0)
MCV: 83.8 fL (ref 80.0–100.0)
Platelets: 323 10*3/uL (ref 150–400)
RBC: 5.85 MIL/uL — ABNORMAL HIGH (ref 4.22–5.81)
RDW: 12.7 % (ref 11.5–15.5)
WBC: 4.7 10*3/uL (ref 4.0–10.5)
nRBC: 0 % (ref 0.0–0.2)

## 2020-09-21 LAB — BASIC METABOLIC PANEL
Anion gap: 10 (ref 5–15)
BUN: 15 mg/dL (ref 6–20)
CO2: 26 mmol/L (ref 22–32)
Calcium: 10 mg/dL (ref 8.9–10.3)
Chloride: 99 mmol/L (ref 98–111)
Creatinine, Ser: 1.08 mg/dL (ref 0.61–1.24)
GFR, Estimated: >60 mL/min (ref >60)
Glucose, Bld: 101 mg/dL — ABNORMAL HIGH (ref 70–99)
Potassium: 3.7 mmol/L (ref 3.5–5.1)
Sodium: 135 mmol/L (ref 135–145)

## 2020-09-21 LAB — TROPONIN I (HIGH SENSITIVITY): Troponin I (High Sensitivity): 2 ng/L (ref <18)

## 2020-09-21 LAB — D-DIMER, QUANTITATIVE: D-Dimer, Quant: 0.27 ug/mL-FEU (ref 0.00–0.50)

## 2020-09-21 MED ORDER — KETOROLAC TROMETHAMINE 15 MG/ML IJ SOLN
15.0000 mg | Freq: Once | INTRAMUSCULAR | Status: AC
  Administered 2020-09-21: 15 mg via INTRAVENOUS
  Filled 2020-09-21: qty 1

## 2020-09-21 NOTE — ED Notes (Signed)
ED Provider at bedside. 

## 2020-09-21 NOTE — ED Triage Notes (Addendum)
Patient c/o intermittent left chest pain that radiates down the left and right arm and dizziness x 3 days.  Patient added that he has also been having tingling in his legs x 3 days.

## 2020-09-21 NOTE — ED Provider Notes (Signed)
[facility]-EMERGENCY DEPT Provider Note   CSN: [id number] Arrival date & time: 09/21/20  1319     History Chief Complaint  Patient presents with   Chest Pain   Dizziness    Jason [name] is a 35 y.o. male.  The history is provided by the patient and medical records.  Chest Pain Associated symptoms: dizziness   Dizziness Associated symptoms: chest pain   Jason [name] is a 35 y.o. male who presents to the Emergency Department complaining of arm pain, chest pain.  He presents to the ED complaining of two days of LUE pain described as tingling and aching.  Tingling is mid forearm to the first and second digits - waxes and wanes.  Aching is left upper arm to elbow.  Today he developed sharp, lateral upper leg pain which comes and goes.  Today he developed left sided chest pain.  Pain is described as heavy and constant.  Pain is better with lying down.  Pain is worse with palpation.  He has associated dizziness.    No fever, sob, diaphoresis, n/v, abdominal pain, edema.  No headache.  No injuries or accidents.  Has some mild stiffness to the right side of his neck since this morning.    Has a hx/o HTN, HPL, smokes cigars occasionally.  No IVDA. No recent tattoos. No family hx/o heart disease.  No hx/o blood clots.    Had patellar surgery in April and may of this year.      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   Acute appendicitis 01/10/2020   Hypercholesteremia 11/04/2018   HTN (hypertension) 10/07/2018    Past Surgical History:  Procedure Laterality Date   APPENDECTOMY     IRRIGATION AND DEBRIDEMENT KNEE Right 07/27/2020   Procedure: IRRIGATION AND DEBRIDEMENT KNEE;  Surgeon: Roby Lofts, MD;  Location: [facility] OR;  Service: Orthopedics;  Laterality: Right;   KNEE SURGERY     LAPAROSCOPIC APPENDECTOMY N/A 01/10/2020   Procedure: APPENDECTOMY LAPAROSCOPIC;  Surgeon: Abigail Miyamoto, MD;  Location: [facility] ORS;  Service: General;  Laterality: N/A;   ORIF PATELLA Right 06/27/2020   Procedure: OPEN REDUCTION INTERNAL (ORIF) FIXATION PATELLA  NONUNION WITH ILIAC CREST MARROW ASPIRATION;  Surgeon: Roby Lofts, MD;  Location: [facility] OR;  Service: Orthopedics;  Laterality: Right;       History reviewed. No pertinent family history.  Social History   Tobacco Use   Smoking status: Former    Types: Cigars   Smokeless tobacco: Never   Tobacco comments:    "every blue moon"  Vaping Use   Vaping Use: Never used  Substance Use Topics   Alcohol use: Yes    Comment: socially   Drug use: Not Currently    Home Medications Prior to Admission medications   Medication Sig Start Date End Date Taking? Authorizing Provider  amLODipine (NORVASC) 10 MG tablet Take 1 tablet (10 mg total) by mouth daily. 01/11/20 09/21/20 Yes Maczis, Elmer Sow, PA-C  atorvastatin (LIPITOR) 80 MG tablet Take 80 mg by mouth daily. 01/04/20  Yes [provider]  lisinopril-hydrochlorothiazide (ZESTORETIC) 20-25 MG tablet Take 1 tablet by mouth daily.   Yes [provider]  valACYclovir (VALTREX) 500 MG  tablet Take 500 mg by mouth daily.   Yes [provider]  oxyCODONE-acetaminophen (PERCOCET) 10-325 MG tablet Take 1 tablet by mouth every 4 (four) hours as needed for pain. Patient not taking: Reported on 09/21/2020 07/27/20   Despina Hidden, PA-C    Allergies    Other  Review of Systems   Review of Systems  Cardiovascular:  Positive for chest pain.  Neurological:  Positive for dizziness.  All other systems reviewed and are negative.  Physical Exam Updated Vital Signs BP 131/82   Pulse 88   Temp 98.3 F (36.8 C) (Oral)   Resp 18   Ht 6\' 1"  (1.854 m)   Wt 106.6 kg   SpO2 97%    BMI 31.00 kg/m   Physical Exam Vitals and nursing note reviewed.  Constitutional:      Appearance: He is well-developed.  HENT:     Head: Normocephalic and atraumatic.  Cardiovascular:     Rate and Rhythm: Normal rate and regular rhythm.     Heart sounds: No murmur heard. Pulmonary:     Effort: Pulmonary effort is normal. No respiratory distress.     Breath sounds: Normal breath sounds.  Chest:     Chest wall: Tenderness present.  Abdominal:     Palpations: Abdomen is soft.     Tenderness: There is no abdominal tenderness. There is no guarding or rebound.  Musculoskeletal:        General: No swelling or tenderness.     Comments: 2+ radial pulses bilaterally  Skin:    General: Skin is warm and dry.  Neurological:     Mental Status: He is alert and oriented to person, place, and time.     Comments: 5/5 strength in all four extremities with sensation to light touch intact in all four extremities.    Psychiatric:        Behavior: Behavior normal.    ED Results / Procedures / Treatments   Labs (all labs ordered are listed, but only abnormal results are displayed) Labs Reviewed  BASIC METABOLIC PANEL - Abnormal; Notable for the following components:      Result Value   Glucose, Bld 101 (*)    All other components within normal limits  CBC - Abnormal; Notable for the following components:   RBC 5.85 (*)    All other components within normal limits  D-DIMER, QUANTITATIVE  TROPONIN I (HIGH SENSITIVITY)  TROPONIN I (HIGH SENSITIVITY)    EKG EKG Interpretation  Date/Time:  Friday September 21 2020 13:27:30 EDT Ventricular Rate:  99 PR Interval:  162 QRS Duration: 86 QT Interval:  323 QTC Calculation: 415 R Axis:   98 Text Interpretation: Sinus rhythm Borderline right axis deviation Borderline T abnormalities, inferior leads 12 Lead; Mason-Likar Confirmed by 12-24-1993 [phone]) on 09/21/2020 3:41:08 PM  Radiology DG Chest 2 View  Result Date: 09/21/2020 CLINICAL DATA:   Provided history: Chest pain. Additional history provided: Patient reports chest pain and left arm pain/tingling for 2 days, worsening. EXAM: CHEST - 2 VIEW COMPARISON:  Prior chest radiographs 09/27/2018 and earlier. FINDINGS: Heart size within normal limits. No appreciable airspace consolidation. No evidence of pleural effusion or pneumothorax. No acute bony abnormality identified. Retained metallic foreign bodies within the right axilla/shoulder soft tissues. IMPRESSION: No evidence of acute cardiopulmonary abnormality. Redemonstrated retained metallic foreign bodies within the right axilla/shoulder soft tissues. Electronically Signed   By: 09/29/2018 DO   On: 09/21/2020 14:10    Procedures Procedures  Medications Ordered in ED Medications  ketorolac (TORADOL) 15 MG/ML injection 15 mg (has no administration in time range)    ED Course  I have reviewed the triage vital signs and the nursing notes.  Pertinent labs & imaging results that were available during my care of the patient were reviewed by me and considered in my medical decision making (see chart for details).    MDM Rules/Calculators/A&P                         patient here for evaluation of chest pain. He is neurologically and vascularly intact on evaluation. Presentation is not consistent with ACS, PE, dissection. Pain is reproducible on evaluation. Discussed with patient home care for chest wall pain. Discussed outpatient follow-up and return precautions.  Final Clinical Impression(s) / ED Diagnoses Final diagnoses:  Chest wall pain    Rx / DC Orders ED Discharge Orders     None        Tilden Fossa, MD 09/21/20 1819

## 2020-09-21 NOTE — ED Provider Notes (Signed)
Emergency Medicine Provider Triage Evaluation Note  Jason [name] , a 35 y.o. male  was evaluated in triage.  Pt complains of central chest pain that radiates to left pectoral region and down left arm.  Patient describes pain as a sharp, throbbing sensation.  Patient states chest pain has been intermittent for the past 3 days.  No associated with exertion.  No pleuritic nature.  Patient denies associated shortness of breath, nausea, vomiting, diaphoresis.  Patient also endorses numbness and tingling to left upper extremity.  He also admits to intermittent pain in the right upper extremity.  Patient had surgery at the end of May on his right lower extremity.  History of blood clots  Review of Systems  Positive: Chest pain Negative: SOB  Physical Exam  BP (!) 148/97 (BP Location: Left Arm)   Pulse 100   Temp 98.3 F (36.8 C) (Oral)   Resp 16   Ht 6\' 1"  (1.854 m)   Wt 106.6 kg   SpO2 99%   BMI 31.00 kg/m  Gen:   Awake, no distress   Resp:  Normal effort  MSK:   Moves extremities without difficulty  Other:  Reproducible left anterior chest wall tenderness  Medical Decision Making  Medically screening exam initiated at 2:02 PM.  Appropriate orders placed.  Jason [name] was informed that the remainder of the evaluation will be completed by another provider, this initial triage assessment does not replace that evaluation, and the importance of remaining in the ED until their evaluation is complete.  Cardiac labs ordered. No hypoxia or tachycardia.   Jason [name] 09/21/20 1405    09/23/20, MD 09/22/20 [phone]

## 2020-11-02 DIAGNOSIS — M25511 Pain in right shoulder: Secondary | ICD-10-CM | POA: Insufficient documentation

## 2020-11-07 ENCOUNTER — Other Ambulatory Visit: Payer: Self-pay

## 2020-11-07 ENCOUNTER — Ambulatory Visit: Payer: BC Managed Care – PPO | Admitting: Podiatry

## 2020-11-07 DIAGNOSIS — L600 Ingrowing nail: Secondary | ICD-10-CM

## 2020-11-08 ENCOUNTER — Encounter: Payer: Self-pay | Admitting: Podiatry

## 2020-11-08 NOTE — Progress Notes (Signed)
Subjective:  Patient ID: Jason [name], male    DOB: 1985-09-01,  MRN: [id number]  Chief Complaint  Patient presents with   Ingrown Toenail    Right hallux     35 y.o. male presents with the above complaint.  Patient presents with complaint of right hallux medial border ingrown.  Is painful to touch.  Patient would like to have removed he has not seen anyone else prior to seeing me.  He denies any other acute complaints.  There is no purulent drainage.  He is tried some self debridement none of which has helped.  He has not seen anyone else.   Review of Systems: Negative except as noted in the HPI. Denies N/V/F/Ch.  Past Medical History:  Diagnosis Date   Complication of anesthesia    broke out in hives from general anesthesia on 06/27/20   HTN (hypertension) 10/07/2018   Hypertension     Current Outpatient Medications:    amLODipine (NORVASC) 10 MG tablet, Take 1 tablet (10 mg total) by mouth daily., Disp: 30 tablet, Rfl: 1   atorvastatin (LIPITOR) 80 MG tablet, Take 80 mg by mouth daily., Disp: , Rfl:    lisinopril-hydrochlorothiazide (ZESTORETIC) 20-25 MG tablet, Take 1 tablet by mouth daily., Disp: , Rfl:    oxyCODONE-acetaminophen (PERCOCET) 10-325 MG tablet, Take 1 tablet by mouth every 4 (four) hours as needed for pain. (Patient not taking: Reported on 09/21/2020), Disp: 42 tablet, Rfl: 0   valACYclovir (VALTREX) 500 MG tablet, Take 500 mg by mouth daily., Disp: , Rfl:   Social History   Tobacco Use  Smoking Status Former   Types: Cigars  Smokeless Tobacco Never  Tobacco Comments   "every blue moon"    Allergies  Allergen Reactions   Other Rash    General anesthesia from 06/27/20 procedure    Objective:  There were no vitals filed for this visit. There is no height or weight on file to calculate BMI. Constitutional Well developed. Well nourished.  Vascular Dorsalis pedis pulses palpable bilaterally. Posterior tibial pulses palpable bilaterally. Capillary  refill normal to all digits.  No cyanosis or clubbing noted. Pedal hair growth normal.  Neurologic Normal speech. Oriented to person, place, and time. Epicritic sensation to light touch grossly present bilaterally.  Dermatologic Painful ingrowing nail at medial nail borders of the hallux nail right. No other open wounds. No skin lesions.  Orthopedic: Normal joint ROM without pain or crepitus bilaterally. No visible deformities. No bony tenderness.   Radiographs: None Assessment:   1. Ingrown toenail of right foot    Plan:  Patient was evaluated and treated and all questions answered.  Ingrown Nail, right -Patient elects to proceed with minor surgery to remove ingrown toenail removal today. Consent reviewed and signed by patient. -Ingrown nail excised. See procedure note. -Educated on post-procedure care including soaking. Written instructions provided and reviewed. -Patient to follow up in 2 weeks for nail check.  Procedure: Excision of Ingrown Toenail Location: Right 1st toe medial nail borders. Anesthesia: Lidocaine 1% plain; 1.5 mL and Marcaine 0.5% plain; 1.5 mL, digital block. Skin Prep: Betadine. Dressing: Silvadene; telfa; dry, sterile, compression dressing. Technique: Following skin prep, the toe was exsanguinated and a tourniquet was secured at the base of the toe. The affected nail border was freed, split with a nail splitter, and excised. Chemical matrixectomy was then performed with phenol and irrigated out with alcohol. The tourniquet was then removed and sterile dressing applied. Disposition: Patient tolerated procedure well. Patient to return in  2 weeks for follow-up.   No follow-ups on file.

## 2021-02-17 ENCOUNTER — Emergency Department (HOSPITAL_COMMUNITY)
Admission: EM | Admit: 2021-02-17 | Discharge: 2021-02-17 | Disposition: A | Discharge status: Home / Self Care | Payer: BC Managed Care – PPO | Attending: Emergency Medicine | Admitting: Emergency Medicine

## 2021-02-17 ENCOUNTER — Emergency Department (HOSPITAL_COMMUNITY): Payer: BC Managed Care – PPO

## 2021-02-17 ENCOUNTER — Other Ambulatory Visit: Payer: Self-pay

## 2021-02-17 ENCOUNTER — Encounter (HOSPITAL_COMMUNITY): Payer: Self-pay | Admitting: Emergency Medicine

## 2021-02-17 DIAGNOSIS — Z79899 Other long term (current) drug therapy: Secondary | ICD-10-CM | POA: Diagnosis not present

## 2021-02-17 DIAGNOSIS — I10 Essential (primary) hypertension: Secondary | ICD-10-CM | POA: Insufficient documentation

## 2021-02-17 DIAGNOSIS — Z20822 Contact with and (suspected) exposure to covid-19: Secondary | ICD-10-CM | POA: Diagnosis not present

## 2021-02-17 DIAGNOSIS — J189 Pneumonia, unspecified organism: Secondary | ICD-10-CM | POA: Insufficient documentation

## 2021-02-17 DIAGNOSIS — M79629 Pain in unspecified upper arm: Secondary | ICD-10-CM | POA: Diagnosis not present

## 2021-02-17 DIAGNOSIS — Z87891 Personal history of nicotine dependence: Secondary | ICD-10-CM | POA: Insufficient documentation

## 2021-02-17 DIAGNOSIS — R0789 Other chest pain: Secondary | ICD-10-CM | POA: Diagnosis present

## 2021-02-17 LAB — RESP PANEL BY RT-PCR (FLU A&B, COVID) ARPGX2
Influenza A by PCR: NEGATIVE
Influenza B by PCR: NEGATIVE
SARS Coronavirus 2 by RT PCR: NEGATIVE

## 2021-02-17 LAB — CBC WITH DIFFERENTIAL/PLATELET
Abs Immature Granulocytes: 0.02 10*3/uL (ref 0.00–0.07)
Basophils Absolute: 0 10*3/uL (ref 0.0–0.1)
Basophils Relative: 1 %
Eosinophils Absolute: 0.1 10*3/uL (ref 0.0–0.5)
Eosinophils Relative: 2 %
HCT: 48.6 % (ref 39.0–52.0)
Hemoglobin: 16 g/dL (ref 13.0–17.0)
Immature Granulocytes: 0 %
Lymphocytes Relative: 38 %
Lymphs Abs: 1.7 10*3/uL (ref 0.7–4.0)
MCH: 27.1 pg (ref 26.0–34.0)
MCHC: 32.9 g/dL (ref 30.0–36.0)
MCV: 82.2 fL (ref 80.0–100.0)
Monocytes Absolute: 0.5 10*3/uL (ref 0.1–1.0)
Monocytes Relative: 11 %
Neutro Abs: 2.1 10*3/uL (ref 1.7–7.7)
Neutrophils Relative %: 48 %
Platelets: 295 10*3/uL (ref 150–400)
RBC: 5.91 MIL/uL — ABNORMAL HIGH (ref 4.22–5.81)
RDW: 12.9 % (ref 11.5–15.5)
WBC: 4.5 10*3/uL (ref 4.0–10.5)
nRBC: 0 % (ref 0.0–0.2)

## 2021-02-17 LAB — BASIC METABOLIC PANEL
Anion gap: 11 (ref 5–15)
BUN: 10 mg/dL (ref 6–20)
CO2: 26 mmol/L (ref 22–32)
Calcium: 9.5 mg/dL (ref 8.9–10.3)
Chloride: 98 mmol/L (ref 98–111)
Creatinine, Ser: 1.22 mg/dL (ref 0.61–1.24)
GFR, Estimated: >60 mL/min (ref >60)
Glucose, Bld: 97 mg/dL (ref 70–99)
Potassium: 3.5 mmol/L (ref 3.5–5.1)
Sodium: 135 mmol/L (ref 135–145)

## 2021-02-17 LAB — TROPONIN I (HIGH SENSITIVITY)
Troponin I (High Sensitivity): 4 ng/L (ref <18)
Troponin I (High Sensitivity): 5 ng/L (ref <18)

## 2021-02-17 LAB — D-DIMER, QUANTITATIVE: D-Dimer, Quant: <0.27 ug/mL-FEU (ref 0.00–0.50)

## 2021-02-17 MED ORDER — IOHEXOL 350 MG/ML SOLN
100.0000 mL | Freq: Once | INTRAVENOUS | Status: AC | PRN
  Administered 2021-02-17: 100 mL via INTRAVENOUS

## 2021-02-17 MED ORDER — KETOROLAC TROMETHAMINE 30 MG/ML IJ SOLN
30.0000 mg | Freq: Once | INTRAMUSCULAR | Status: AC
  Administered 2021-02-17: 30 mg via INTRAVENOUS
  Filled 2021-02-17: qty 1

## 2021-02-17 MED ORDER — AMOXICILLIN 500 MG PO CAPS: 500.0000 mg | ORAL_CAPSULE | Freq: Three times a day (TID) | ORAL | 0 refills | Status: DC

## 2021-02-17 MED ORDER — AMLODIPINE BESYLATE 5 MG PO TABS
10.0000 mg | ORAL_TABLET | Freq: Once | ORAL | Status: AC
  Administered 2021-02-17: 10 mg via ORAL
  Filled 2021-02-17: qty 2

## 2021-02-17 NOTE — Discharge Instructions (Addendum)
You have not had a heart attack and you do not have a blood clot in your lungs. There does appear to be a pneumonia in your right middle lung lobe. I have sent antibiotics to the pharmacy for you to take.  I believe you should follow-up with your primary care provider about your symptoms if they continue.  They may further check your cholesterol and send you to cardiology if needed.  You may take ibuprofen for your discomfort however do not take Tylenol because it is already built into the Percocet you are taking.

## 2021-02-17 NOTE — ED Triage Notes (Signed)
C/o constant sharp L sided chest pain since this morning that radiates to L arm.  Vomited x 1.  Mild SOB.  Denies nausea.

## 2021-02-17 NOTE — ED Provider Notes (Addendum)
Emergency Medicine Provider Triage Evaluation Note  Jason [name] , a 35 y.o. male  was evaluated in triage.  Pt complains of chest pain.  He states that this morning he woke up with constant, sharp, left-sided chest pain that he states radiates down his left arm.  He states he also had 1 episode of nausea and vomiting.  No hematemesis.  He states that he does feel slightly short of breath and had to open the window on the way here.  He denies diaphoresis.  He denies palpitations.  No fevers or upper respiratory symptoms.  Review of Systems  Positive: See above Negative:   Physical Exam  BP (!) 145/104 (BP Location: Left Arm)   Pulse 100   Temp 98.9 F (37.2 C)   Resp 20   SpO2 99%  Gen:   Awake, no distress   Resp:  Normal effort  MSK:   Moves extremities without difficulty  Other:  S1/S2 without murmur.  Lungs are clear to auscultation bilaterally.  Medical Decision Making  Medically screening exam initiated at 9:44 AM.  Appropriate orders placed.  Jason [name] was informed that the remainder of the evaluation will be completed by another provider, this initial triage assessment does not replace that evaluation, and the importance of remaining in the ED until their evaluation is complete.  S1Q3T3 pattern on EKG with tachycardia and pleuritic type chest pain.  Will obtain D-dimer.   Cristopher Peru, PA-C 02/17/21 0947    Cristopher Peru, PA-C 02/17/21 1111    Alvira Monday, MD 02/18/21 [phone]

## 2021-02-17 NOTE — ED Provider Notes (Signed)
[facility] EMERGENCY DEPARTMENT Provider Note   CSN: [id number] Arrival date & time: 02/17/21  0908     History Chief Complaint  Patient presents with   Chest Pain    Jason [name] is a 35 y.o. male with past medical history of hypertension and hyperlipidemia presenting today with complaint of chest pain and arm pain.  Patient reports that this morning he woke up and had central chest pain that was sharp and it has not improved since then.  Also had left arm pain and numbness.  No history of ACS.  No history of MI in family.  Occasional shortness of breath and vomited when he woke up this morning.  Rates the pain a 7 out of 10.  Somewhat increases with inspiration.  No history of DVT/PE.  Non-smoker.  Reports he did not take his blood pressure medication this morning and would like a dose today.   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   Acute appendicitis 01/10/2020   Hypercholesteremia 11/04/2018   HTN (hypertension) 10/07/2018    Past Surgical History:  Procedure Laterality Date   APPENDECTOMY     IRRIGATION AND DEBRIDEMENT KNEE Right 07/27/2020   Procedure: IRRIGATION AND DEBRIDEMENT KNEE;  Surgeon: Shona Needles, MD;  Location: [facility];  Service: Orthopedics;  Laterality: Right;   KNEE SURGERY     LAPAROSCOPIC APPENDECTOMY N/A 01/10/2020   Procedure: APPENDECTOMY LAPAROSCOPIC;  Surgeon: Coralie Keens, MD;  Location: [facility];  Service: General;  Laterality: N/A;   ORIF PATELLA Right 06/27/2020   Procedure: OPEN REDUCTION INTERNAL (ORIF) FIXATION PATELLA  NONUNION WITH ILIAC CREST MARROW ASPIRATION;  Surgeon: Shona Needles, MD;  Location: [facility];  Service: Orthopedics;  Laterality: Right;        No family history on file.  Social History   Tobacco Use   Smoking status: Former    Types: Cigars   Smokeless tobacco: Never   Tobacco comments:    "every blue moon"  Vaping Use   Vaping Use: Never used  Substance Use Topics   Alcohol use: Yes    Comment: socially   Drug use: Not Currently    Home Medications Prior to Admission medications   Medication Sig Start Date End Date Taking? Authorizing Provider  amLODipine (NORVASC) 10 MG tablet Take 1 tablet (10 mg total) by mouth daily. 01/11/20 09/21/20  Maczis, Barth Kirks, PA-C  atorvastatin (LIPITOR) 80 MG tablet Take 80 mg by mouth daily. 01/04/20   [provider]  lisinopril-hydrochlorothiazide (ZESTORETIC) 20-25 MG tablet Take 1 tablet by mouth daily.    [provider]  oxyCODONE-acetaminophen (PERCOCET) 10-325 MG tablet Take 1 tablet by mouth every 4 (four) hours as needed for pain. Patient not taking: Reported on 09/21/2020 07/27/20   Delray Alt, PA-C  valACYclovir (VALTREX) 500 MG tablet Take 500 mg by mouth daily.    [provider]    Allergies    Other  Review of Systems   Review of Systems  Constitutional:  Positive for diaphoresis. Negative for chills and fever.  HENT:  Negative for congestion.   Respiratory:  Positive for chest tightness.   Cardiovascular:  Positive for chest pain. Negative for palpitations and  leg swelling.  Gastrointestinal:  Positive for vomiting. Negative for nausea.  Neurological:  Negative for dizziness and syncope.   Physical Exam Updated Vital Signs BP (!) 145/104 (BP Location: Left Arm)   Pulse 100   Temp 98.9 F (37.2 C)   Resp 20   SpO2 99%   Physical Exam Vitals and nursing note reviewed.  Constitutional:      General: He is not in acute distress.    Appearance: Normal appearance.  HENT:     Head: Normocephalic and atraumatic.  Eyes:     General: No scleral icterus.    Conjunctiva/sclera: Conjunctivae normal.  Cardiovascular:      Rate and Rhythm: Normal rate and regular rhythm.     Heart sounds: Normal heart sounds.  Pulmonary:     Effort: Pulmonary effort is normal. No respiratory distress.     Breath sounds: No decreased breath sounds or wheezing.  Musculoskeletal:     Right lower leg: No edema.     Left lower leg: No edema.  Skin:    General: Skin is warm and dry.     Findings: No rash.  Neurological:     General: No focal deficit present.     Mental Status: He is alert.  Psychiatric:        Mood and Affect: Mood normal.    ED Results / Procedures / Treatments   Labs (all labs ordered are listed, but only abnormal results are displayed) Labs Reviewed  CBC WITH DIFFERENTIAL/PLATELET - Abnormal; Notable for the following components:      Result Value   RBC 5.91 (*)    All other components within normal limits  RESP PANEL BY RT-PCR (FLU A&B, COVID) ARPGX2  BASIC METABOLIC PANEL  D-DIMER, QUANTITATIVE  TROPONIN I (HIGH SENSITIVITY)  TROPONIN I (HIGH SENSITIVITY)    EKG EKG Interpretation  Date/Time:  Sunday February 17 2021 09:38:23 EST Ventricular Rate:  99 PR Interval:  162 QRS Duration: 82 QT Interval:  350 QTC Calculation: 449 R Axis:   88 Text Interpretation: Normal sinus rhythm Normal ECG No significant change since last tracing Confirmed by Blanchie Dessert [phone]) on 02/17/2021 12:12:10 PM  Radiology DG Chest 2 View  Result Date: 02/17/2021 CLINICAL DATA:  Chest pain EXAM: CHEST - 2 VIEW COMPARISON:  Chest x-ray dated September 21, 2020 FINDINGS: The heart size and mediastinal contours are within normal limits. Both lungs are clear. The visualized skeletal structures are unremarkable. IMPRESSION: No active cardiopulmonary disease. Electronically Signed   By: Yetta Glassman M.D.   On: 02/17/2021 10:19    CTA: Right middle lobe infiltrate, inflammation vs PNA.   Procedures Procedures   Medications Ordered in ED Medications  amLODipine (NORVASC) tablet 10 mg (10 mg Oral Given  02/17/21 1224)  ketorolac (TORADOL) 30 MG/ML injection 30 mg (30 mg Intravenous Given 02/17/21 1224)  iohexol (OMNIPAQUE) 350 MG/ML injection 100 mL (100 mLs Intravenous Contrast Given 02/17/21 1248)    ED Course  I have reviewed the triage vital signs and the nursing notes.  Pertinent labs & imaging results that were available during my care of the patient were reviewed by me and considered in my medical decision making (see chart for details).    MDM Rules/Calculators/A&P The emergent differential diagnosis of chest pain includes: Acute coronary syndrome, pericarditis, aortic dissection, pulmonary embolism, tension pneumothorax, and esophageal rupture.  All of these were considered throughout the evaluation of the patient.  History of hypertension and hyperlipidemia presenting with chest pain and  left arm pain.  Pain from chest is not radiating to her arm.  1 episode of emesis this morning.  Heart score of 4.  Denied any history of DVT, lower leg swelling, cough or recent surgery.  Chest pain constant and pleuritic in nature.  ACS work-up negative.  Although he had a negative D-dimer and a low likelihood of PE, CT imaging pursued due to severity of pleuritic pain.  CTA revealing of a possible pneumonia in the right lobe of his lung.  Unsure if this is causing his chest pain however we will treat with antibiotics.  He will follow-up with his primary care provider about his discomfort and start the antibiotics as discussed.  Requesting and stable for discharge.  Final Clinical Impression(s) / ED Diagnoses Final diagnoses:  Community acquired pneumonia of right middle lobe of lung    Rx / DC Orders Results and diagnoses were explained to the patient. Return precautions discussed in full. Patient had no additional questions and expressed complete understanding.     Saddie Benders, PA-C 02/17/21 1502    Gwyneth Sprout, MD 02/23/21 [phone]

## 2021-03-07 DIAGNOSIS — M25561 Pain in right knee: Secondary | ICD-10-CM | POA: Insufficient documentation

## 2021-03-19 DIAGNOSIS — S82031K Displaced transverse fracture of right patella, subsequent encounter for closed fracture with nonunion: Secondary | ICD-10-CM | POA: Insufficient documentation

## 2021-03-19 DIAGNOSIS — S82032A Displaced transverse fracture of left patella, initial encounter for closed fracture: Secondary | ICD-10-CM | POA: Insufficient documentation

## 2021-03-22 DIAGNOSIS — Z4789 Encounter for other orthopedic aftercare: Secondary | ICD-10-CM | POA: Insufficient documentation

## 2021-04-07 IMAGING — MR MR KNEE*R* W/O CM
6 series · 40 of 40 positions shown · non-contrast
Comparison: None.

CLINICAL DATA: Status post fall off a truck. Right knee pain. Date
of injury 09/20/2019. Prior surgery 15 years ago to the right
patella.

EXAM:
MRI OF THE RIGHT KNEE WITHOUT CONTRAST
TECHNIQUE: Multiplanar, multisequence MR imaging of the knee was performed. No
intravenous contrast was administered.

[Series 6: T2 fat-sat · axial · right · 4.0mm · 0.50mm/px · z∈[-69,+85]mm · 7 of 36 slices shown (1 of 3)]
[im 1/36]
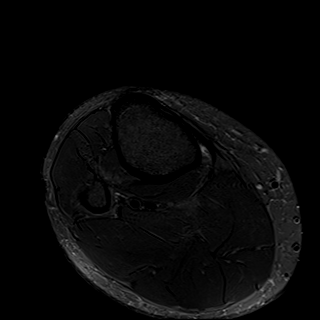
[im 6/36]
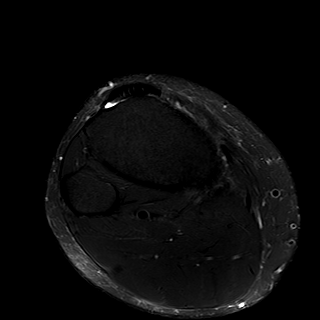
[im 12/36]
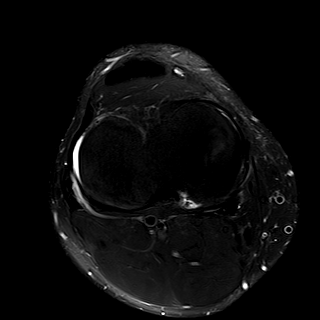
[im 18/36]
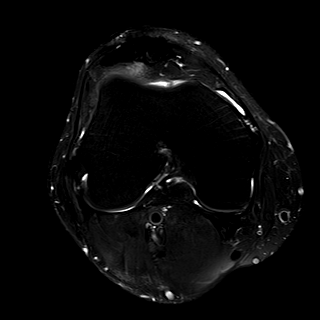
[im 24/36]
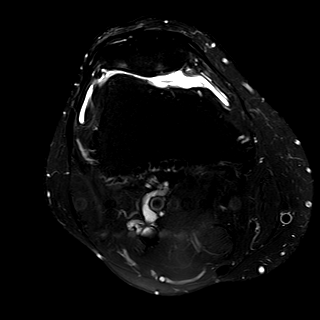
[im 30/36]
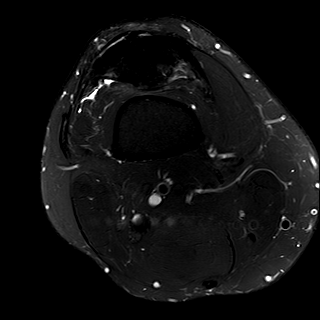
[im 36/36]
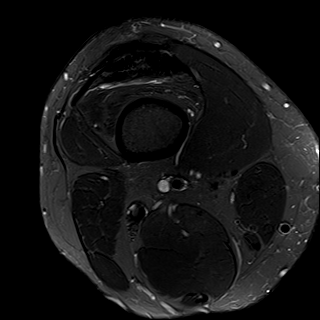

[Series 7: T2 fat-sat · coronal · right · 4.0mm · 0.39mm/px · 6 of 29 slices shown (2 of 3)]
[im 1/29]
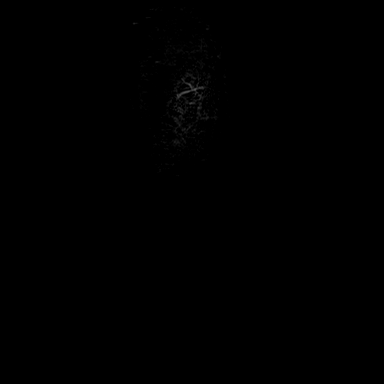
[im 6/29]
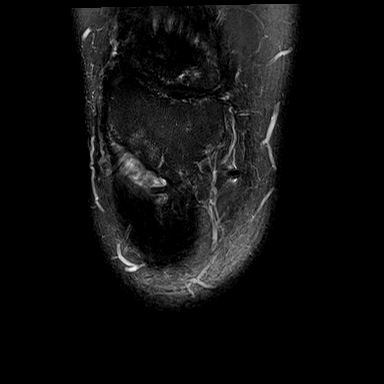
[im 12/29]
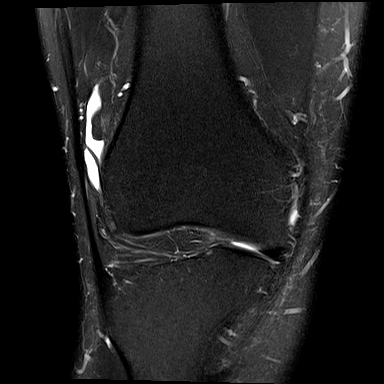
[im 17/29]
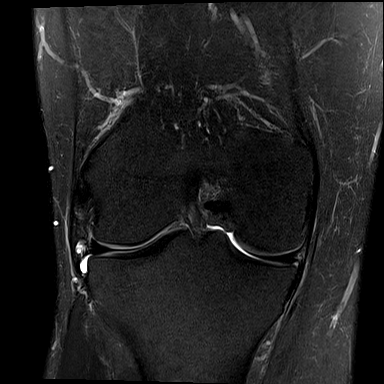
[im 23/29]
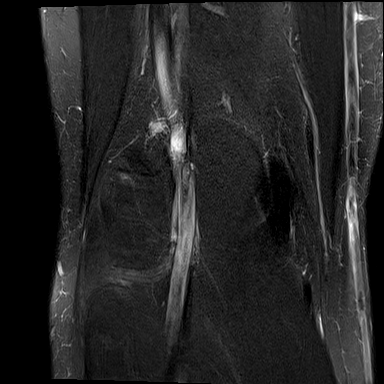
[im 29/29]
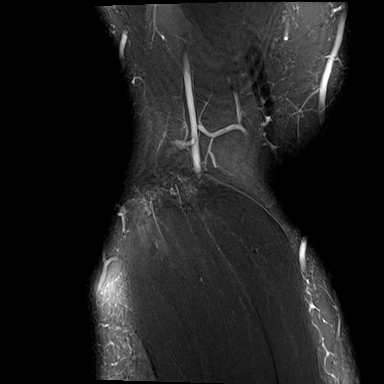

[Series 8: T1 · coronal · right · 4.0mm · 0.39mm/px · 6 of 29 slices shown]
[im 1/29]
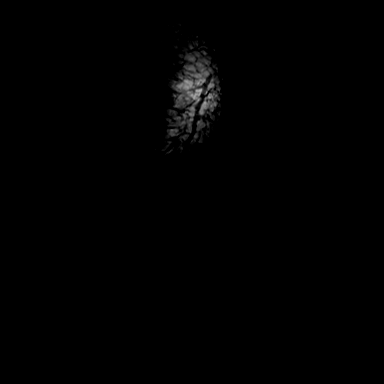
[im 6/29]
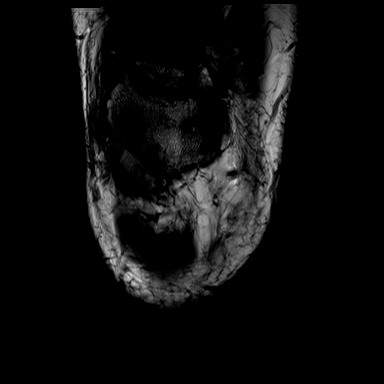
[im 12/29]
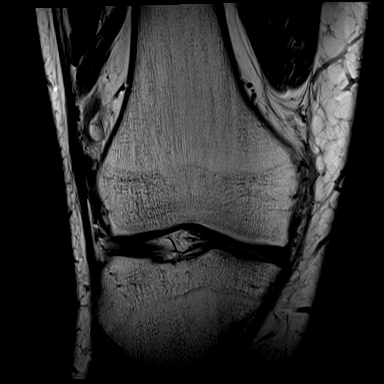
[im 17/29]
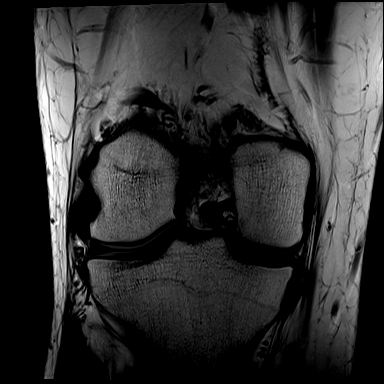
[im 23/29]
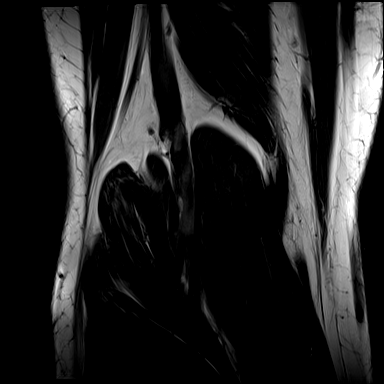
[im 29/29]
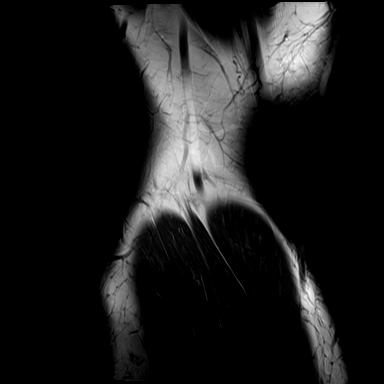

[Series 9: PD fat-sat · coronal · right · 3.0mm · 0.47mm/px · 7 of 35 slices shown (1 of 2)]
[im 1/35]
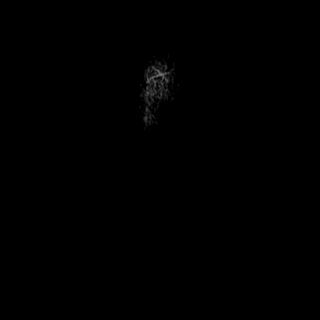
[im 6/35]
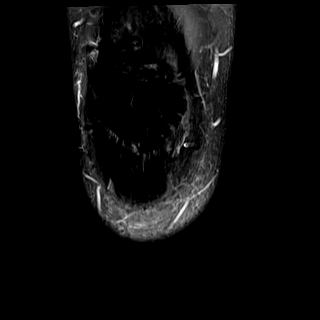
[im 12/35]
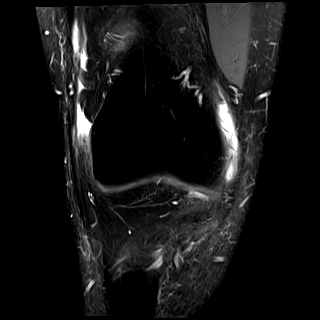
[im 18/35]
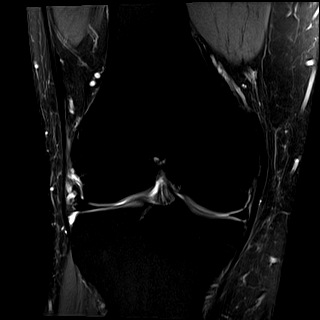
[im 23/35]
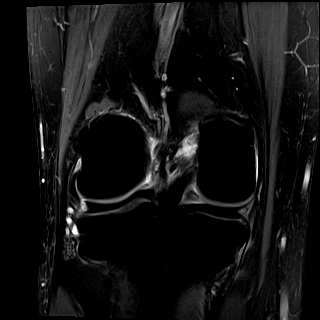
[im 29/35]
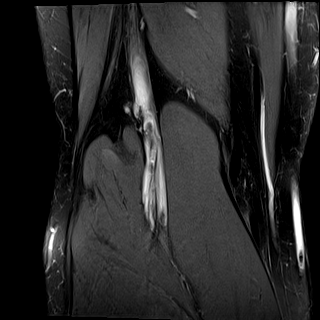
[im 35/35]
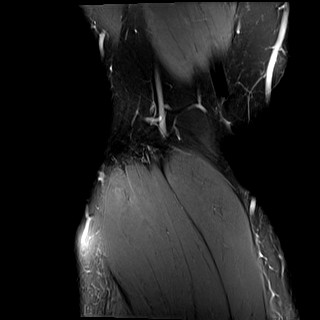

[Series 10: PD fat-sat · sagittal · right · 3.0mm · 0.53mm/px · 7 of 32 slices shown (2 of 2)]
[im 1/32]
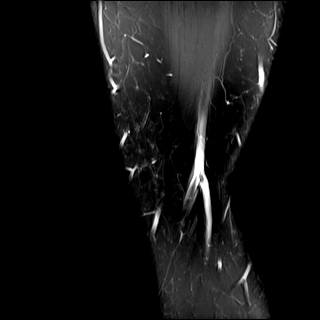
[im 6/32]
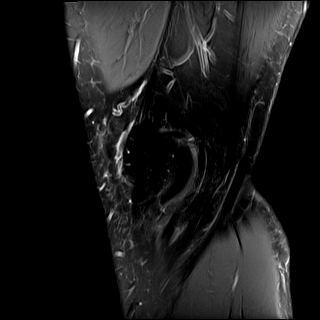
[im 11/32]
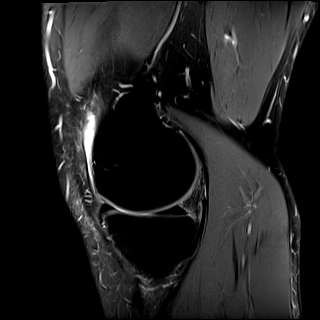
[im 16/32]
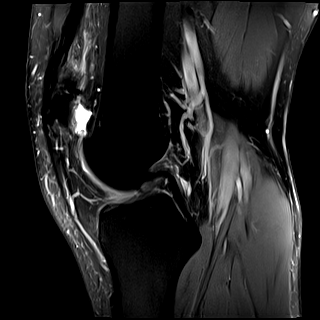
[im 21/32]
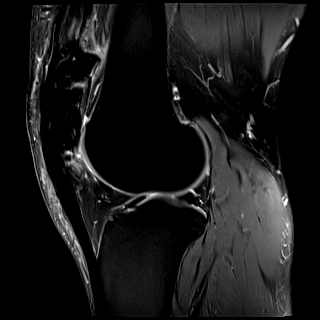
[im 26/32]
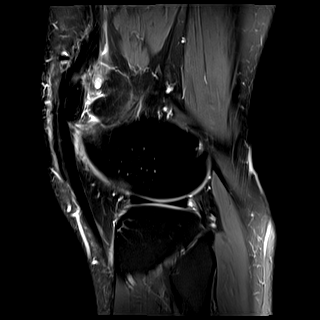
[im 32/32]
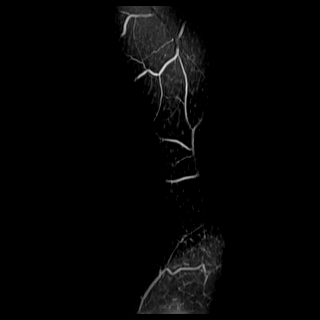

[Series 11: T2 fat-sat · sagittal · right · 3.0mm · 0.53mm/px · 7 of 32 slices shown (3 of 3)]
[im 1/32]
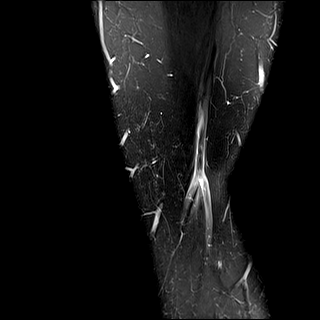
[im 6/32]
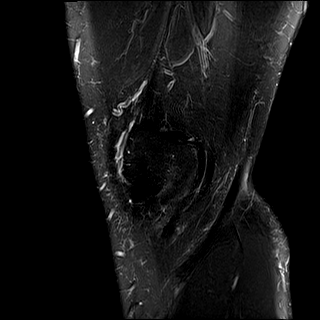
[im 11/32]
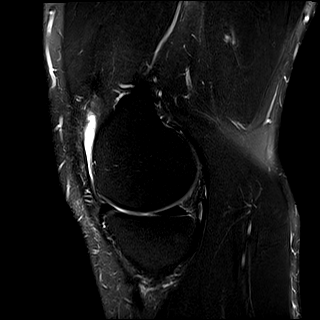
[im 16/32]
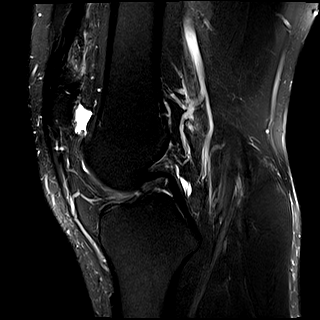
[im 21/32]
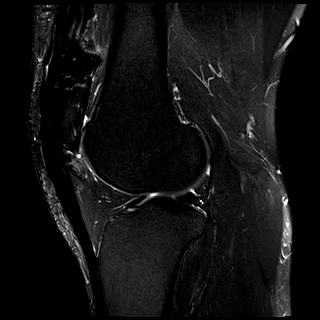
[im 26/32]
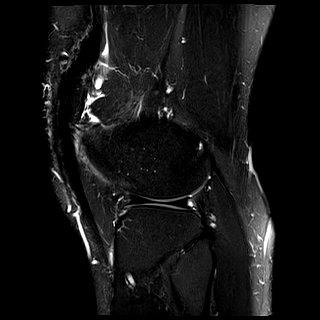
[im 32/32]
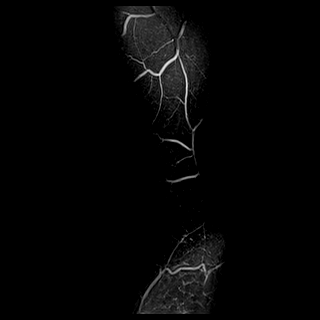

[40 of 40 positions shown; findings below may reference images not displayed]

FINDINGS: MENISCI

Medial: Intact.

Lateral: Intact.

LIGAMENTS

Cruciates: ACL and PCL are intact.

Collaterals: Medial collateral ligament is intact. Lateral
collateral ligament complex is intact.

CARTILAGE

Patellofemoral: High-grade partial-thickness cartilage loss of the
lateral patellar facet and lateral trochlea with areas of
full-thickness cartilage loss of the lateral patellar facet and
subchondral reactive marrow edema. High-grade partial-thickness
cartilage loss of the medial patellar facet.

Medial:  No chondral defect.

Lateral:  No chondral defect.

JOINT: No joint effusion. Normal Kwadwoa Jarquin. No plical
thickening.

POPLITEAL FOSSA: Popliteus tendon is intact. No Baker's cyst.

EXTENSOR MECHANISM: Intact quadriceps tendon. Intact patellar
tendon. Intact lateral patellar retinaculum. Intact medial patellar
retinaculum. Intact MPFL.

BONES: Ununited transverse fracture through the upper pole of the
patella with small foci of susceptibility in the suprapatellar joint
space. No acute fracture or dislocation. No aggressive osseous
lesion.

Other: No fluid collection or hematoma. Muscles are normal.
IMPRESSION: 1. High-grade partial-thickness cartilage loss of the lateral
patellar facet and lateral trochlea with areas of full-thickness
cartilage loss of the lateral patellar facet and subchondral
reactive marrow edema. High-grade partial-thickness cartilage loss
of the medial patellar facet.
2. Ununited transverse fracture through the upper pole of the
patella with small foci of susceptibility in the suprapatellar joint
space.

## 2021-07-29 DIAGNOSIS — Z9889 Other specified postprocedural states: Secondary | ICD-10-CM | POA: Insufficient documentation

## 2021-09-05 DIAGNOSIS — M7651 Patellar tendinitis, right knee: Secondary | ICD-10-CM | POA: Insufficient documentation

## 2021-09-05 DIAGNOSIS — M76891 Other specified enthesopathies of right lower limb, excluding foot: Secondary | ICD-10-CM | POA: Insufficient documentation

## 2021-10-16 DIAGNOSIS — M25661 Stiffness of right knee, not elsewhere classified: Secondary | ICD-10-CM | POA: Insufficient documentation

## 2021-10-30 DIAGNOSIS — M25521 Pain in right elbow: Secondary | ICD-10-CM | POA: Insufficient documentation

## 2021-10-30 DIAGNOSIS — S46291D Other injury of muscle, fascia and tendon of other parts of biceps, right arm, subsequent encounter: Secondary | ICD-10-CM | POA: Insufficient documentation

## 2021-10-30 DIAGNOSIS — S52121A Displaced fracture of head of right radius, initial encounter for closed fracture: Secondary | ICD-10-CM | POA: Insufficient documentation

## 2022-04-04 ENCOUNTER — Emergency Department (HOSPITAL_COMMUNITY)
Admission: EM | Admit: 2022-04-04 | Discharge: 2022-04-04 | Disposition: A | Discharge status: Home / Self Care | Payer: BC Managed Care – PPO | Attending: Emergency Medicine | Admitting: Emergency Medicine

## 2022-04-04 ENCOUNTER — Emergency Department (HOSPITAL_COMMUNITY): Payer: BC Managed Care – PPO

## 2022-04-04 DIAGNOSIS — R079 Chest pain, unspecified: Secondary | ICD-10-CM

## 2022-04-04 DIAGNOSIS — R519 Headache, unspecified: Secondary | ICD-10-CM | POA: Diagnosis not present

## 2022-04-04 DIAGNOSIS — R0789 Other chest pain: Secondary | ICD-10-CM | POA: Insufficient documentation

## 2022-04-04 LAB — CBC
HCT: 53.8 % — ABNORMAL HIGH (ref 39.0–52.0)
Hemoglobin: 17.1 g/dL — ABNORMAL HIGH (ref 13.0–17.0)
MCH: 27.1 pg (ref 26.0–34.0)
MCHC: 31.8 g/dL (ref 30.0–36.0)
MCV: 85.4 fL (ref 80.0–100.0)
Platelets: 311 10*3/uL (ref 150–400)
RBC: 6.3 MIL/uL — ABNORMAL HIGH (ref 4.22–5.81)
RDW: 12.9 % (ref 11.5–15.5)
WBC: 3.6 10*3/uL — ABNORMAL LOW (ref 4.0–10.5)
nRBC: 0 % (ref 0.0–0.2)

## 2022-04-04 LAB — BASIC METABOLIC PANEL
Anion gap: 8 (ref 5–15)
BUN: 9 mg/dL (ref 6–20)
CO2: 25 mmol/L (ref 22–32)
Calcium: 9.8 mg/dL (ref 8.9–10.3)
Chloride: 99 mmol/L (ref 98–111)
Creatinine, Ser: 1.05 mg/dL (ref 0.61–1.24)
GFR, Estimated: >60 mL/min (ref >60)
Glucose, Bld: 90 mg/dL (ref 70–99)
Potassium: 3.5 mmol/L (ref 3.5–5.1)
Sodium: 132 mmol/L — ABNORMAL LOW (ref 135–145)

## 2022-04-04 LAB — TROPONIN I (HIGH SENSITIVITY)
Troponin I (High Sensitivity): 3 ng/L (ref <18)
Troponin I (High Sensitivity): 4 ng/L (ref <18)

## 2022-04-04 MED ORDER — BUTALBITAL-APAP-CAFFEINE 50-325-40 MG PO TABS: 1.0000 | ORAL_TABLET | Freq: Four times a day (QID) | ORAL | 0 refills | Status: DC | PRN

## 2022-04-04 NOTE — ED Provider Notes (Signed)
[facility] Provider Note   CSN: [id number] Arrival date & time: 04/04/22  9518     History  Chief Complaint  Patient presents with   Chest Pain    Jason [name] is a 37 y.o. male.   Chest Pain    Patient presents ED for evaluation of headaches as well as chest discomfort.  Patient states he been having symptoms off and on for the last for couple days.  He feels like he is having migraine headaches.  He takes over-the-counter Tylenol and it helps but the headache returns.  He has had blood pressure slightly elevated in the 140s.  He has been taking his home medications.  He was also having some chest pain on the left side of his chest this morning that went into his arm.  Patient denies any fevers or chills.  No numbness or weakness.  No vomiting or diarrhea.  No shortness of breath.  Home Medications Prior to Admission medications   Medication Sig Start Date End Date Taking? Authorizing Provider  butalbital-acetaminophen-caffeine (FIORICET) 50-325-40 MG tablet Take 1-2 tablets by mouth every 6 (six) hours as needed for headache. 04/04/22 04/04/23 Yes Dorie Rank, MD  amLODipine (NORVASC) 10 MG tablet Take 1 tablet (10 mg total) by mouth daily. 01/11/20 09/21/20  Maczis, Barth Kirks, PA-C  amoxicillin (AMOXIL) 500 MG capsule Take 1 capsule (500 mg total) by mouth 3 (three) times daily. 02/17/21   Redwine, Madison A, PA-C  atorvastatin (LIPITOR) 80 MG tablet Take 80 mg by mouth daily. 01/04/20   [provider]  lisinopril-hydrochlorothiazide (ZESTORETIC) 20-25 MG tablet Take 1 tablet by mouth daily.    [provider]  valACYclovir (VALTREX) 500 MG tablet Take 500 mg by mouth daily.    [provider]      Allergies    Other    Review of Systems   Review of Systems  Cardiovascular:  Positive for chest pain.    Physical Exam Updated Vital Signs BP (!) 130/92   Pulse 73   Temp 98.1 F (36.7 C)   Resp 17    SpO2 98%  Physical Exam Vitals and nursing note reviewed.  Constitutional:      General: He is not in acute distress.    Appearance: He is well-developed.  HENT:     Head: Normocephalic and atraumatic.     Right Ear: External ear normal.     Left Ear: External ear normal.  Eyes:     General: No visual field deficit or scleral icterus.       Right eye: No discharge.        Left eye: No discharge.     Conjunctiva/sclera: Conjunctivae normal.  Neck:     Trachea: No tracheal deviation.  Cardiovascular:     Rate and Rhythm: Normal rate and regular rhythm.  Pulmonary:     Effort: Pulmonary effort is normal. No respiratory distress.     Breath sounds: Normal breath sounds. No stridor. No wheezing or rales.  Abdominal:     General: Bowel sounds are normal. There is no distension.     Palpations: Abdomen is soft.     Tenderness: There is no abdominal tenderness. There is no guarding or rebound.  Musculoskeletal:        General: No tenderness.     Cervical back: Neck supple.  Skin:    General: Skin is warm and dry.     Findings: No rash.  Neurological:  Mental Status: He is alert and oriented to person, place, and time.     Cranial Nerves: No cranial nerve deficit, dysarthria or facial asymmetry.     Sensory: No sensory deficit.     Motor: No abnormal muscle tone, seizure activity or pronator drift.     Coordination: Coordination normal.     Comments: able to hold both legs off bed for 5 seconds, sensation intact in all extremities,  no left or right sided neglect,  no nystagmus noted   Psychiatric:        Mood and Affect: Mood normal.     ED Results / Procedures / Treatments   Labs (all labs ordered are listed, but only abnormal results are displayed) Labs Reviewed  BASIC METABOLIC PANEL - Abnormal; Notable for the following components:      Result Value   Sodium 132 (*)    All other components within normal limits  CBC - Abnormal; Notable for the following  components:   WBC 3.6 (*)    RBC 6.30 (*)    Hemoglobin 17.1 (*)    HCT 53.8 (*)    All other components within normal limits  TROPONIN I (HIGH SENSITIVITY)  TROPONIN I (HIGH SENSITIVITY)    EKG EKG Interpretation  Date/Time:  Friday April 04 2022 10:46:17 EST Ventricular Rate:  82 PR Interval:  166 QRS Duration: 82 QT Interval:  360 QTC Calculation: 420 R Axis:   115 Text Interpretation: Normal sinus rhythm Left posterior fascicular block Inferior infarct , age undetermined Abnormal ECG When compared with ECG of 17-Feb-2021 09:38, No significant change since last tracing Confirmed by Linwood Dibbles [phone]) on 04/04/2022 3:58:14 PM  Radiology DG Chest 2 View  Result Date: 04/04/2022 CLINICAL DATA:  Chest pain radiating down left arm for 1 day. EXAM: CHEST - 2 VIEW COMPARISON:  Chest two views 02/17/2021 FINDINGS: Cardiac silhouette and mediastinal contours are within normal limits. There is again mild-to-moderate hypoinflation. No pleural effusion or pneumothorax. Normal regional bones. Metallic densities again overlie the right axilla. IMPRESSION: Mild-to-moderate hypoinflation. No acute lung process. Electronically Signed   By: Neita Garnet M.D.   On: 04/04/2022 12:12    Procedures Procedures    Medications Ordered in ED Medications - No data to display  ED Course/ Medical Decision Making/ A&P Clinical Course as of 04/04/22 1632  Fri Apr 04, 2022  1630 Chest x-ray without acute findings.  CBC and metabolic panel remarkable.  Serial troponins normal [JK]    Clinical Course User Index [JK] Linwood Dibbles, MD                             Medical Decision Making Problems Addressed: Chest pain, unspecified type: acute illness or injury that poses a threat to life or bodily functions Nonintractable headache, unspecified chronicity pattern, unspecified headache type: acute illness or injury that poses a threat to life or bodily functions  Amount and/or Complexity of Data  Reviewed Labs: ordered. Decision-making details documented in ED Course. Radiology: ordered and independent interpretation performed.  Risk Prescription drug management.    presented to the ED with complaints of headaches as well as chest discomfort.  Patient with a low risk heart score.  Laboratory tests troponins are normal at this time.  Doubt acute coronary syndrome.  Patient is not short of breath.  PERC negative.  Doubt PE.  Patient also complaining of headache but no focal neurologic deficits.  Doubt TIA stroke.  It is possibly having migraine type headache.  Evaluation and diagnostic testing in the emergency department does not suggest an emergent condition requiring admission or immediate intervention beyond what has been performed at this time.  The patient is safe for discharge and has been instructed to return immediately for worsening symptoms, change in symptoms or any other concerns.        Final Clinical Impression(s) / ED Diagnoses Final diagnoses:  Chest pain, unspecified type  Nonintractable headache, unspecified chronicity pattern, unspecified headache type    Rx / DC Orders ED Discharge Orders          Ordered    butalbital-acetaminophen-caffeine (FIORICET) 50-325-40 MG tablet  Every 6 hours PRN        04/04/22 1627              Dorie Rank, MD 04/04/22 [phone]

## 2022-04-04 NOTE — ED Provider Triage Note (Signed)
Emergency Medicine Provider Triage Evaluation Note  Jason [name] , a 37 y.o. male  was evaluated in triage.  Pt complains of headache over the past few days and has been intermittent. Reports he was taking Tylenol and it would end, but then returned. Reports his BP has been high in the sys 140. Additionally, he reports that he was having chest pains this AM. Noi SOB.  Review of Systems  Positive:  Negative:   Physical Exam  BP (!) 146/94   Pulse 82   Temp 98.4 F (36.9 C)   Resp 17   SpO2 99%  Gen:   Awake, no distress   Resp:  Normal effort  MSK:   Moves extremities without difficulty  Other:  Cranial nerves grossly intact. Chest wall tenderness to palpation.   Medical Decision Making  Medically screening exam initiated at 11:11 AM.  Appropriate orders placed.  Jason [name] was informed that the remainder of the evaluation will be completed by another provider, this initial triage assessment does not replace that evaluation, and the importance of remaining in the ED until their evaluation is complete.  Chest pain order set placed.    Sherrell Puller, PA-C 04/04/22 1113

## 2022-04-04 NOTE — ED Triage Notes (Signed)
Patient here with complaint of chest pain and headache that started a few days ago. Patient denies shortness of breath, denies dizziness. Patient is alert, oriented, and in no apparent distress at this time.

## 2022-04-04 NOTE — Discharge Instructions (Signed)
Take the medications as needed to help with your headache.  Continue your current medications for your blood pressure and cholesterol.  Follow-up with your primary care doctor to be rechecked.  Return for fevers numbness weakness or other concerning symptoms

## 2022-07-14 ENCOUNTER — Ambulatory Visit
Admission: EM | Admit: 2022-07-14 | Discharge: 2022-07-14 | Disposition: A | Discharge status: Home / Self Care | Payer: Medicaid Other | Attending: Internal Medicine | Admitting: Internal Medicine

## 2022-07-14 DIAGNOSIS — R369 Urethral discharge, unspecified: Secondary | ICD-10-CM

## 2022-07-14 DIAGNOSIS — J069 Acute upper respiratory infection, unspecified: Secondary | ICD-10-CM | POA: Insufficient documentation

## 2022-07-14 MED ORDER — BENZONATATE 100 MG PO CAPS: 100.0000 mg | ORAL_CAPSULE | Freq: Three times a day (TID) | ORAL | 0 refills | Status: DC

## 2022-07-14 MED ORDER — PROMETHAZINE-DM 6.25-15 MG/5ML PO SYRP: 5.0000 mL | ORAL_SOLUTION | Freq: Every evening | ORAL | 0 refills | Status: DC | PRN

## 2022-07-14 NOTE — Discharge Instructions (Signed)
You have a viral upper respiratory infection.   Use the following medicines to help with symptoms: - Plain Mucinex (guaifenesin) over the counter as directed every 12 hours to thin mucous so that you are able to get it out of your body easier. Drink plenty of water while taking this medication so that it works well in your body (at least 8 cups a day).  - Tylenol 1,000mg  and/or ibuprofen 600mg  every 6 hours with food as needed for aches/pains or fever/chills.  - Tessalon perles every 8 hours as needed for cough. - Take Promethazine DM cough medication to help with your cough at nighttime so that you are able to sleep. Do not drive, drink alcohol, or go to work while taking this medication since it can make you sleepy. Only take this at nighttime.  - Zyrtec will help to dry up fluid behind your eardrums contributing to fullness sensation.  1 tablespoon of honey in warm water and/or salt water gargles may also help with symptoms. Humidifier to your room will help add water to the air and reduce coughing.  Your STD testing has been sent to the lab and will come back in the next 2 to 3 days.  We will call you if any of your results are positive requiring treatment and treat you at that time.   If you do not receive a phone call from [place], this means your testing was negative.  Avoid sexual intercourse until your STD results come back.  If any of your STD results are positive, you will need to avoid sexual intercourse for 7 days while you are being treated to prevent spread of STD.  Condom use is the best way to prevent spread of STDs.  Return to urgent care as needed.   If you develop any new or worsening symptoms, please return.  If your symptoms are severe, please go to the emergency room.  Follow-up with your primary care provider for further evaluation and management of your symptoms as well as ongoing wellness visits.  I hope you feel better!

## 2022-07-14 NOTE — ED Triage Notes (Signed)
Pt c/o cough, wheezing, chest congestion, ears feel "cloudy," pt feels warm, nasal congestion with drainage,   Onset ~ 7 days ago

## 2022-07-14 NOTE — ED Provider Notes (Signed)
[name]    CSN: [id number] Arrival date & time: 07/14/22  0815      History   Chief Complaint Chief Complaint  Patient presents with   Cough    HPI Jason [name] is a 37 y.o. male.   Patient presents to urgent care for evaluation of cough, nasal congestion, sore throat, generalized fatigue, and bilateral ear pain that started approximately 1 week ago. He states he recently traveled to [place] by plane and is reporting bilateral ear fullness sensation with decreased hearing bilaterally. Cough is productive with yellow/green phlegm and worse at nighttime.  Reports chills at the beginning of illness without known documented fever at home.  No recent known sick contacts with similar symptoms.  No orthopnea, leg swelling, shortness of breath, chest pain, dizziness, N/V/D, abdominal pain, or fever.  He smokes cigarettes socially whenever he drinks alcohol socially but denies other drug use.  No history of chronic respiratory problems.  Has been using Mucinex as needed for symptoms without much relief.  Patient would also like to report penile discharge that has been intermittent over the last few days.  No recent known exposure to STD or recent new sexual contacts.  No dysuria, urinary frequency, urinary urgency, or urinary hesitancy.  No abdominal pain, penile itching, penile rash, or recent antibiotic or steroid use.  He would like to be tested for STDs today.   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   Acute appendicitis 01/10/2020   Hypercholesteremia 11/04/2018   HTN (hypertension) 10/07/2018    Past Surgical History:  Procedure Laterality Date   APPENDECTOMY     IRRIGATION AND DEBRIDEMENT  KNEE Right 07/27/2020   Procedure: IRRIGATION AND DEBRIDEMENT KNEE;  Surgeon: Roby Lofts, MD;  Location: [facility] OR;  Service: Orthopedics;  Laterality: Right;   KNEE SURGERY     LAPAROSCOPIC APPENDECTOMY N/A 01/10/2020   Procedure: APPENDECTOMY LAPAROSCOPIC;  Surgeon: Abigail Miyamoto, MD;  Location: [facility];  Service: General;  Laterality: N/A;   ORIF PATELLA Right 06/27/2020   Procedure: OPEN REDUCTION INTERNAL (ORIF) FIXATION PATELLA  NONUNION WITH ILIAC CREST MARROW ASPIRATION;  Surgeon: Roby Lofts, MD;  Location: [facility] OR;  Service: Orthopedics;  Laterality: Right;       Home Medications    Prior to Admission medications   Medication Sig Start Date End Date Taking? Authorizing Provider  benzonatate (TESSALON) 100 MG capsule Take 1 capsule (100 mg total) by mouth every 8 (eight) hours. 07/14/22  Yes Carlisle Beers, FNP  promethazine-dextromethorphan (PROMETHAZINE-DM) 6.25-15 MG/5ML syrup Take 5 mLs by mouth at bedtime as needed for cough. 07/14/22  Yes Carlisle Beers, FNP  amLODipine (NORVASC) 10 MG tablet Take 1 tablet (10 mg total) by mouth daily. 01/11/20 09/21/20  Maczis, Elmer Sow, PA-C  amoxicillin (AMOXIL) 500 MG capsule Take 1 capsule (500 mg total) by mouth 3 (three) times daily. 02/17/21   Redwine, Madison A, PA-C  atorvastatin (LIPITOR) 80 MG tablet Take 80 mg by mouth daily. 01/04/20   [provider]  butalbital-acetaminophen-caffeine (FIORICET) 50-325-40 MG tablet Take 1-2 tablets by mouth every 6 (six) hours as needed for headache. 04/04/22 04/04/23  Linwood Dibbles, MD  lisinopril-hydrochlorothiazide (ZESTORETIC) 20-25  MG tablet Take 1 tablet by mouth daily.    [provider]  valACYclovir (VALTREX) 500 MG tablet Take 500 mg by mouth daily.    [provider]    Family History History reviewed. No pertinent family history.  Social History Social History   Tobacco Use   Smoking status: Former    Types: Cigars   Smokeless tobacco:  Never   Tobacco comments:    "every blue moon"  Vaping Use   Vaping Use: Never used  Substance Use Topics   Alcohol use: Yes    Comment: socially   Drug use: Not Currently     Allergies   Other   Review of Systems Review of Systems  Respiratory:  Positive for cough.   Per HPI   Physical Exam Triage Vital Signs ED Triage Vitals  Enc Vitals Group     BP 07/14/22 0926 (!) 156/72     Pulse Rate 07/14/22 0925 74     Resp 07/14/22 0925 16     Temp 07/14/22 0925 98.6 F (37 C)     Temp Source 07/14/22 0925 Oral     SpO2 07/14/22 0925 98 %     Weight --      Height --      Head Circumference --      Peak Flow --      Pain Score 07/14/22 0925 9     Pain Loc --      Pain Edu? --      Excl. in GC? --    No data found.  Updated Vital Signs BP (!) 156/72   Pulse 74   Temp 98.6 F (37 C) (Oral)   Resp 16   SpO2 98%   Visual Acuity Right Eye Distance:   Left Eye Distance:   Bilateral Distance:    Right Eye Near:   Left Eye Near:    Bilateral Near:     Physical Exam Vitals and nursing note reviewed.  Constitutional:      Appearance: He is not ill-appearing or toxic-appearing.  HENT:     Head: Normocephalic and atraumatic.     Right Ear: Hearing, ear canal and external ear normal. No decreased hearing noted. No drainage. A middle ear effusion is present.     Left Ear: Hearing, ear canal and external ear normal. No decreased hearing noted. No drainage. A middle ear effusion is present.     Ears:     Comments: Tympanic membrane's intact bilaterally.    Nose: Nose normal.     Mouth/Throat:     Lips: Pink.     Mouth: Mucous membranes are moist. No injury.     Tongue: No lesions. Tongue does not deviate from midline.     Palate: No mass and lesions.     Pharynx: Oropharynx is clear. Uvula midline. No pharyngeal swelling, oropharyngeal exudate, posterior oropharyngeal erythema or uvula swelling.     Tonsils: No tonsillar exudate or tonsillar abscesses.  Eyes:      General: Lids are normal. Vision grossly intact. Gaze aligned appropriately.     Extraocular Movements: Extraocular movements intact.     Conjunctiva/sclera: Conjunctivae normal.  Cardiovascular:     Rate and Rhythm: Normal rate and regular rhythm.     Heart sounds: Normal heart sounds, S1 normal and S2 normal.  Pulmonary:     Effort: Pulmonary effort is normal. No respiratory distress.     Breath sounds: Normal breath sounds and air entry. No decreased  breath sounds, wheezing, rhonchi or rales.  Musculoskeletal:     Cervical back: Full passive range of motion without pain and neck supple.  Lymphadenopathy:     Cervical: No cervical adenopathy.  Skin:    General: Skin is warm and dry.     Capillary Refill: Capillary refill takes less than 2 seconds.     Findings: No rash.  Neurological:     General: No focal deficit present.     Mental Status: He is alert and oriented to person, place, and time. Mental status is at baseline.     Cranial Nerves: No dysarthria or facial asymmetry.  Psychiatric:        Mood and Affect: Mood normal.        Speech: Speech normal.        Behavior: Behavior normal.        Thought Content: Thought content normal.        Judgment: Judgment normal.      UC Treatments / Results  Labs (all labs ordered are listed, but only abnormal results are displayed) Labs Reviewed  CYTOLOGY, (ORAL, ANAL, URETHRAL) ANCILLARY ONLY    EKG   Radiology No results found.  Procedures Procedures (including critical care time)  Medications Ordered in UC Medications - No data to display  Initial Impression / Assessment and Plan / UC Course  I have reviewed the triage vital signs and the nursing notes.  Pertinent labs & imaging results that were available during my care of the patient were reviewed by me and considered in my medical decision making (see chart for details).   1.  Viral URI with cough Symptoms and physical exam consistent with a viral upper  respiratory tract infection that will likely resolve with rest, fluids, and prescriptions for symptomatic relief. Deferred imaging based on stable cardiopulmonary exam and hemodynamically stable vital signs. Deferred viral testing using shared decision making as patient is low risk for severe disease related to potential COVID-19 illness and there is low suspicion for influenza. Discussed updated CDC guidelines for masking related to COVID-19.   Tessalon perles and promethazine sent to pharmacy for symptomatic relief to be taken as prescribed.  May continue taking over the counter medications as directed for further symptomatic relief.  Drowsiness precautions discussed regarding promethazine DM prescription.  Zyrtec may be used daily to help dry up fluid behind the eardrums contributing to discomfort.  Nonpharmacologic interventions for symptom relief provided and after visit summary below. Advised to push fluids to stay well hydrated while recovering from viral illness.   STI labs pending.  Patient declines HIV and syphilis testing today.  Will notify patient of positive results and treat accordingly when labs come back.  Patient to avoid sexual intercourse until screening testing comes back.  Education provided regarding safe sexual practices and patient encouraged to use protection to prevent spread of STIs.    Discussed physical exam and available lab work findings in clinic with patient.  Counseled patient regarding appropriate use of medications and potential side effects for all medications recommended or prescribed today. Discussed red flag signs and symptoms of worsening condition,when to call the PCP office, return to urgent care, and when to seek higher level of care in the emergency department. Patient verbalizes understanding and agreement with plan. All questions answered. Patient discharged in stable condition.   Final Clinical Impressions(s) / UC Diagnoses   Final diagnoses:  Viral URI  with cough  Penile discharge     Discharge Instructions  You have a viral upper respiratory infection.   Use the following medicines to help with symptoms: - Plain Mucinex (guaifenesin) over the counter as directed every 12 hours to thin mucous so that you are able to get it out of your body easier. Drink plenty of water while taking this medication so that it works well in your body (at least 8 cups a day).  - Tylenol 1,000mg  and/or ibuprofen 600mg  every 6 hours with food as needed for aches/pains or fever/chills.  - Tessalon perles every 8 hours as needed for cough. - Take Promethazine DM cough medication to help with your cough at nighttime so that you are able to sleep. Do not drive, drink alcohol, or go to work while taking this medication since it can make you sleepy. Only take this at nighttime.  - Zyrtec will help to dry up fluid behind your eardrums contributing to fullness sensation.  1 tablespoon of honey in warm water and/or salt water gargles may also help with symptoms. Humidifier to your room will help add water to the air and reduce coughing.  Your STD testing has been sent to the lab and will come back in the next 2 to 3 days.  We will call you if any of your results are positive requiring treatment and treat you at that time.   If you do not receive a phone call from [place], this means your testing was negative.  Avoid sexual intercourse until your STD results come back.  If any of your STD results are positive, you will need to avoid sexual intercourse for 7 days while you are being treated to prevent spread of STD.  Condom use is the best way to prevent spread of STDs.  Return to urgent care as needed.   If you develop any new or worsening symptoms, please return.  If your symptoms are severe, please go to the emergency room.  Follow-up with your primary care provider for further evaluation and management of your symptoms as well as ongoing wellness visits.  I hope  you feel better!     ED Prescriptions     Medication Sig Dispense Auth. Provider   benzonatate (TESSALON) 100 MG capsule Take 1 capsule (100 mg total) by mouth every 8 (eight) hours. 21 capsule Carlisle Beers, FNP   promethazine-dextromethorphan (PROMETHAZINE-DM) 6.25-15 MG/5ML syrup Take 5 mLs by mouth at bedtime as needed for cough. 118 mL Carlisle Beers, FNP      PDMP not reviewed this encounter.   Carlisle Beers, [place] 07/14/22 1015

## 2022-07-14 NOTE — ED Triage Notes (Signed)
Pt continued to report penile discharge last week that has spontaneously resolved requesting sti screening.

## 2022-07-15 LAB — CYTOLOGY, (ORAL, ANAL, URETHRAL) ANCILLARY ONLY
Chlamydia: NEGATIVE
Comment: NEGATIVE
Comment: NEGATIVE
Comment: NORMAL
Neisseria Gonorrhea: NEGATIVE
Trichomonas: NEGATIVE

## 2023-01-27 ENCOUNTER — Ambulatory Visit
Admission: EM | Admit: 2023-01-27 | Discharge: 2023-01-27 | Disposition: A | Discharge status: Home / Self Care | Payer: Medicaid Other

## 2023-01-27 DIAGNOSIS — Z1152 Encounter for screening for COVID-19: Secondary | ICD-10-CM | POA: Diagnosis not present

## 2023-01-27 DIAGNOSIS — R42 Dizziness and giddiness: Secondary | ICD-10-CM | POA: Insufficient documentation

## 2023-01-27 DIAGNOSIS — I10 Essential (primary) hypertension: Secondary | ICD-10-CM | POA: Insufficient documentation

## 2023-01-27 LAB — POC COVID19/FLU A&B COMBO
Covid Antigen, POC: NEGATIVE
Influenza A Antigen, POC: NEGATIVE
Influenza B Antigen, POC: NEGATIVE

## 2023-01-27 MED ORDER — PROMETHAZINE-DM 6.25-15 MG/5ML PO SYRP: 5.0000 mL | ORAL_SOLUTION | Freq: Every evening | ORAL | 0 refills | Status: DC | PRN

## 2023-01-27 MED ORDER — BENZONATATE 100 MG PO CAPS: 100.0000 mg | ORAL_CAPSULE | Freq: Three times a day (TID) | ORAL | 0 refills | Status: DC

## 2023-01-27 NOTE — ED Triage Notes (Signed)
Patient presents for eval of weakness, feeling lightheaded, and body aches onset Sunday. Reports hx of HTN. Has not taken meds today. Diarrhea yesterday and last night.States he has been coughing a lot as well. Took home covid test yesterday and reports negative reading.

## 2023-01-28 LAB — CBC WITH DIFFERENTIAL/PLATELET
Basophils Absolute: 0 10*3/uL (ref 0.0–0.2)
Basos: 1 %
EOS (ABSOLUTE): 0.2 10*3/uL (ref 0.0–0.4)
Eos: 4 %
Hematocrit: 51.2 % — ABNORMAL HIGH (ref 37.5–51.0)
Hemoglobin: 15.5 g/dL (ref 13.0–17.7)
Immature Grans (Abs): 0 10*3/uL (ref 0.0–0.1)
Immature Granulocytes: 1 %
Lymphocytes Absolute: 1.7 10*3/uL (ref 0.7–3.1)
Lymphs: 35 %
MCH: 25.1 pg — ABNORMAL LOW (ref 26.6–33.0)
MCHC: 30.3 g/dL — ABNORMAL LOW (ref 31.5–35.7)
MCV: 83 fL (ref 79–97)
Monocytes Absolute: 0.5 10*3/uL (ref 0.1–0.9)
Monocytes: 11 %
Neutrophils Absolute: 2.3 10*3/uL (ref 1.4–7.0)
Neutrophils: 48 %
Platelets: 423 10*3/uL (ref 150–450)
RBC: 6.18 x10E6/uL — ABNORMAL HIGH (ref 4.14–5.80)
RDW: 14.2 % (ref 11.6–15.4)
WBC: 4.7 10*3/uL (ref 3.4–10.8)

## 2023-01-28 LAB — COMPREHENSIVE METABOLIC PANEL
ALT: 70 [IU]/L — ABNORMAL HIGH (ref 0–44)
AST: 54 [IU]/L — ABNORMAL HIGH (ref 0–40)
Albumin: 4.6 g/dL (ref 4.1–5.1)
Alkaline Phosphatase: 43 [IU]/L — ABNORMAL LOW (ref 44–121)
BUN/Creatinine Ratio: 8 — ABNORMAL LOW (ref 9–20)
BUN: 9 mg/dL (ref 6–20)
Bilirubin Total: 0.3 mg/dL (ref 0.0–1.2)
CO2: 23 mmol/L (ref 20–29)
Calcium: 9.7 mg/dL (ref 8.7–10.2)
Chloride: 101 mmol/L (ref 96–106)
Creatinine, Ser: 1.18 mg/dL (ref 0.76–1.27)
Globulin, Total: 3 g/dL (ref 1.5–4.5)
Glucose: 80 mg/dL (ref 70–99)
Potassium: 4.4 mmol/L (ref 3.5–5.2)
Sodium: 138 mmol/L (ref 134–144)
Total Protein: 7.6 g/dL (ref 6.0–8.5)
eGFR: 81 mL/min/{1.73_m2} (ref >59)

## 2023-01-28 LAB — SARS CORONAVIRUS 2 (TAT 6-24 HRS): SARS Coronavirus 2: NEGATIVE

## 2023-02-07 NOTE — ED Provider Notes (Signed)
[name]    CSN: [id number] Arrival date & time: 01/27/23  0818      History   Chief Complaint Chief Complaint  Patient presents with   Dizziness   Diarrhea    HPI Jason [name] is a 37 y.o. male.   Patient here today for evaluation of generalized weakness, lightheadedness, body aches that started a few days ago.  He does have history of blood pressure issues and has not taken his blood pressure medication today.  He notes that yesterday he did have some diarrhea.  He has been coughing a lot as well.  He took COVID test at home yesterday that was negative.  The history is provided by the patient.  Dizziness Associated symptoms: diarrhea   Associated symptoms: no chest pain, no headaches, no nausea, no shortness of breath and no vomiting   Diarrhea Associated symptoms: no abdominal pain, no fever, no headaches and no vomiting     Past Medical History:  Diagnosis Date   Complication of anesthesia    broke out in hives from general anesthesia on 06/27/20   HTN (hypertension) 10/07/2018   Hypertension     Patient Active Problem List   Diagnosis Date Noted   Pain in joint of right elbow 10/30/2021   Closed fracture of head of right radius 10/30/2021   Stiffness of right knee 10/16/2021   Tendonitis of knee, right 09/05/2021   History of arthroscopy of knee 07/29/2021   Encounter for orthopedic follow-up care 03/22/2021   Closed displaced transverse fracture of left patella 03/19/2021   Pain in joint of right knee 03/07/2021   Pain of right shoulder joint on movement 11/02/2020   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   Acute appendicitis 01/10/2020   Hypercholesteremia 11/04/2018   Essential hypertension 11/04/2018   Hypercholesterolemia 11/04/2018   HTN (hypertension) 10/07/2018   Hypertensive disorder 10/07/2018    Past Surgical History:  Procedure Laterality Date   APPENDECTOMY     IRRIGATION AND DEBRIDEMENT  KNEE Right 07/27/2020   Procedure: IRRIGATION AND DEBRIDEMENT KNEE;  Surgeon: Roby Lofts, MD;  Location: [facility] OR;  Service: Orthopedics;  Laterality: Right;   KNEE SURGERY     LAPAROSCOPIC APPENDECTOMY N/A 01/10/2020   Procedure: APPENDECTOMY LAPAROSCOPIC;  Surgeon: Abigail Miyamoto, MD;  Location: [facility];  Service: General;  Laterality: N/A;   ORIF PATELLA Right 06/27/2020   Procedure: OPEN REDUCTION INTERNAL (ORIF) FIXATION PATELLA  NONUNION WITH ILIAC CREST MARROW ASPIRATION;  Surgeon: Roby Lofts, MD;  Location: [facility];  Service: Orthopedics;  Laterality: Right;       Home Medications    Prior to Admission medications   Medication Sig Start Date End Date Taking? Authorizing Provider  amLODipine (NORVASC) 10 MG tablet Take 10 mg by mouth daily. 04/07/22  Yes [provider]  co-enzyme Q-10 30 MG capsule Take 30 mg by mouth 3 (three) times daily. 11/04/18  Yes [provider]  cromolyn (OPTICROM) 4 % ophthalmic solution Place 1 drop into both eyes 4 (four) times daily. 12/11/22  Yes [provider]  Cyanocobalamin POWD Take by mouth. 11/04/18  Yes [provider]  fluorometholone (FML) 0.1 % ophthalmic suspension Place 1 drop into both eyes 4 (four) times daily. 11/18/22  Yes [provider]  HYDROcodone-acetaminophen (NORCO) 10-325 MG tablet Take 1 tablet by mouth every 6 (six) hours as needed for severe pain (pain score 7-10). 04/07/22  Yes [provider]  ibuprofen (ADVIL) 800 MG  tablet Take 800 mg by mouth 3 (three) times daily as needed. 11/07/22  Yes [provider]  naloxone (NARCAN) nasal spray 4 mg/0.1 mL Place 1 spray into the nose once. 04/07/22  Yes [provider]  tobramycin-dexamethasone Wallene Dales) ophthalmic solution Place 1 drop into both eyes every 4 (four) hours while awake. 11/07/22  Yes [provider]  atorvastatin (LIPITOR) 80 MG tablet Take 80 mg by mouth daily. 01/04/20   [provider]  baclofen (LIORESAL) 20 MG tablet Take 1 tablet by mouth 3 (three) times daily as needed.    [provider]  benzonatate (TESSALON) 100 MG capsule Take 1 capsule (100 mg total) by mouth every 8 (eight) hours. 01/27/23   Tomi Bamberger, PA-C  gabapentin (NEURONTIN) 100 MG capsule Take 100 mg by mouth as directed.    [provider]  HYDROcodone-acetaminophen (NORCO) 7.5-325 MG tablet Take 1 tablet by mouth every 6 (six) hours as needed for severe pain (pain score 7-10).    [provider]  lisinopril-hydrochlorothiazide (ZESTORETIC) 20-25 MG tablet Take 1 tablet by mouth daily.    [provider]  meloxicam (MOBIC) 15 MG tablet Take 15 mg by mouth daily.    [provider]  naloxone [facility]) nasal spray 4 mg/0.1 mL Place 1 spray into the nose once.    [provider]  oxyCODONE-acetaminophen (PERCOCET) 10-325 MG tablet Take 1 tablet by mouth 4 (four) times daily as needed.    [provider]  promethazine-dextromethorphan (PROMETHAZINE-DM) 6.25-15 MG/5ML syrup Take 5 mLs by mouth at bedtime as needed for cough. 01/27/23   Tomi Bamberger, PA-C  valACYclovir (VALTREX) 500 MG tablet Take 500 mg by mouth daily.    [provider]    Family History History reviewed. No pertinent family history.  Social History Social History   Tobacco Use   Smoking status: Former    Types: Cigars   Smokeless tobacco: Never   Tobacco comments:    "every blue moon"  Vaping Use   Vaping status: Never Used  Substance Use Topics   Alcohol use: Yes    Comment: socially   Drug use: Not Currently     Allergies   No known allergies and Other   Review of Systems Review of Systems  Constitutional:  Negative for appetite change and fever.  HENT:  Positive for congestion and sore throat. Negative for ear pain.   Eyes:  Negative for discharge and redness.  Respiratory:  Positive for cough. Negative for shortness of  breath.   Cardiovascular:  Negative for chest pain.  Gastrointestinal:  Positive for diarrhea. Negative for abdominal pain, nausea and vomiting.  Neurological:  Positive for light-headedness. Negative for headaches.     Physical Exam Triage Vital Signs ED Triage Vitals  Encounter Vitals Group     BP 01/27/23 0852 (!) 152/91     Systolic BP Percentile --      Diastolic BP Percentile --      Pulse Rate 01/27/23 0852 98     Resp 01/27/23 0852 18     Temp 01/27/23 0852 98.6 F (37 C)     Temp Source 01/27/23 0852 Oral     SpO2 01/27/23 0852 98 %     Weight --      Height --      Head Circumference --      Peak Flow --      Pain Score 01/27/23 0853 10     Pain Loc --  Pain Education --      Exclude from Growth Chart --    No data found.  Updated Vital Signs BP (!) 152/91 (BP Location: Left Arm)   Pulse 98   Temp 98.6 F (37 C) (Oral)   Resp 18   SpO2 98%   Visual Acuity Right Eye Distance:   Left Eye Distance:   Bilateral Distance:    Right Eye Near:   Left Eye Near:    Bilateral Near:     Physical Exam Vitals and nursing note reviewed.  Constitutional:      General: He is not in acute distress.    Appearance: Normal appearance. He is not ill-appearing.  HENT:     Head: Normocephalic and atraumatic.     Nose: Congestion present.     Mouth/Throat:     Mouth: Mucous membranes are moist.     Pharynx: Oropharynx is clear. No oropharyngeal exudate or posterior oropharyngeal erythema.  Eyes:     Conjunctiva/sclera: Conjunctivae normal.  Cardiovascular:     Rate and Rhythm: Normal rate and regular rhythm.     Heart sounds: Normal heart sounds. No murmur heard. Pulmonary:     Effort: Pulmonary effort is normal. No respiratory distress.     Breath sounds: Normal breath sounds. No wheezing, rhonchi or rales.  Skin:    General: Skin is warm and dry.  Neurological:     Mental Status: He is alert.  Psychiatric:        Mood and Affect: Mood normal.         Thought Content: Thought content normal.      UC Treatments / Results  Labs (all labs ordered are listed, but only abnormal results are displayed) Labs Reviewed  COMPREHENSIVE METABOLIC PANEL - Abnormal; Notable for the following components:      Result Value   BUN/Creatinine Ratio 8 (*)    Alkaline Phosphatase 43 (*)    AST 54 (*)    ALT 70 (*)    All other components within normal limits   Narrative:    Performed at:  [address], [place], [place]  [id number] Lab Director: Jolene Schimke MD, Phone:  [phone]  CBC WITH DIFFERENTIAL/PLATELET - Abnormal; Notable for the following components:   RBC 6.18 (*)    Hematocrit 51.2 (*)    MCH 25.1 (*)    MCHC 30.3 (*)    All other components within normal limits   Narrative:    Performed at:  [address], [place], [place]  [id number] Lab Director: Jolene Schimke MD, Phone:  [phone]  POC COVID19/FLU A&B COMBO - Normal  SARS CORONAVIRUS 2 (TAT 6-24 HRS)    EKG   Radiology No results found.  Procedures Procedures (including critical care time)  Medications Ordered in UC Medications - No data to display  Initial Impression / Assessment and Plan / UC Course  I have reviewed the triage vital signs and the nursing notes.  Pertinent labs & imaging results that were available during my care of the patient were reviewed by me and considered in my medical decision making (see chart for details).   Suspect viral etiology of upper respiratory symptoms- will treat to cover symptoms and encouraged follow up with any further concerns. Discussed elevated BP- will order basic labs. Encourage follow up with PCP.    Final Clinical Impressions(s) / UC Diagnoses   Final diagnoses:  Lightheadedness  Primary hypertension   Discharge Instructions  None    ED Prescriptions     Medication Sig Dispense Auth. Provider   promethazine-dextromethorphan (PROMETHAZINE-DM) 6.25-15  MG/5ML syrup Take 5 mLs by mouth at bedtime as needed for cough. 118 mL Erma Pinto F, PA-C   benzonatate (TESSALON) 100 MG capsule Take 1 capsule (100 mg total) by mouth every 8 (eight) hours. 21 capsule Tomi Bamberger, PA-C      PDMP not reviewed this encounter.   Tomi Bamberger, PA-C 02/07/23 1447

## 2023-06-05 ENCOUNTER — Ambulatory Visit: Admission: EM | Admit: 2023-06-05 | Discharge: 2023-06-05 | Disposition: A | Discharge status: Home / Self Care

## 2023-06-05 ENCOUNTER — Ambulatory Visit (INDEPENDENT_AMBULATORY_CARE_PROVIDER_SITE_OTHER)

## 2023-06-05 DIAGNOSIS — R0602 Shortness of breath: Secondary | ICD-10-CM

## 2023-06-05 DIAGNOSIS — R52 Pain, unspecified: Secondary | ICD-10-CM | POA: Diagnosis not present

## 2023-06-05 DIAGNOSIS — R062 Wheezing: Secondary | ICD-10-CM | POA: Diagnosis not present

## 2023-06-05 DIAGNOSIS — R051 Acute cough: Secondary | ICD-10-CM | POA: Diagnosis not present

## 2023-06-05 MED ORDER — ALBUTEROL SULFATE HFA 108 (90 BASE) MCG/ACT IN AERS: 2.0000 | INHALATION_SPRAY | RESPIRATORY_TRACT | 0 refills | Status: AC | PRN

## 2023-06-05 MED ORDER — AMOXICILLIN-POT CLAVULANATE 875-125 MG PO TABS: 1.0000 | ORAL_TABLET | Freq: Two times a day (BID) | ORAL | 0 refills | Status: AC

## 2023-06-05 MED ORDER — VENTOLIN HFA 108 (90 BASE) MCG/ACT IN AERS: 1.0000 | INHALATION_SPRAY | RESPIRATORY_TRACT | 0 refills | Status: DC | PRN

## 2023-06-05 MED ORDER — PREDNISONE 10 MG (21) PO TBPK: ORAL_TABLET | Freq: Every day | ORAL | 0 refills | Status: DC

## 2023-06-05 MED ORDER — DEXTROMETHORPHAN-GUAIFENESIN 10-100 MG/5ML PO LIQD: 10.0000 mL | ORAL | 0 refills | Status: DC | PRN

## 2023-06-05 MED ORDER — PROMETHAZINE-DM 6.25-15 MG/5ML PO SYRP: 5.0000 mL | ORAL_SOLUTION | Freq: Four times a day (QID) | ORAL | 0 refills | Status: AC | PRN

## 2023-06-05 MED ORDER — BENZONATATE 100 MG PO CAPS: 100.0000 mg | ORAL_CAPSULE | Freq: Three times a day (TID) | ORAL | 0 refills | Status: DC | PRN

## 2023-06-05 NOTE — ED Triage Notes (Signed)
"  I have been in the bed since Tuesday, constants sweats, hard to breath on/off, the first time out of the bed was today". No fever known. Some runny nose previous (resolved now). No cough. "Sneezing some". "My whole body aches right now like crazy".

## 2023-06-05 NOTE — ED Provider Notes (Signed)
 [name]    CSN: [id number] Arrival date & time: 06/05/23  1234      History   Chief Complaint Chief Complaint  Patient presents with   Abnormal Breathing   Weakness   Sweats (all the time)    HPI Jason [name] is a 38 y.o. male.   Patient presents today with cough shortness of breath fever body aches chills since Tuesday.  Patient states that his shortness of breath has become worse over the past few days.  Has not been taking any medication prior to arrival.  Denies any chest pain no nausea vomiting or diarrhea    Past Medical History:  Diagnosis Date   Complication of anesthesia    broke out in hives from general anesthesia on 06/27/20   HTN (hypertension) 10/07/2018   Hypertension     Patient Active Problem List   Diagnosis Date Noted   Pain in joint of right elbow 10/30/2021   Closed fracture of head of right radius 10/30/2021   Stiffness of right knee 10/16/2021   Tendonitis of knee, right 09/05/2021   History of arthroscopy of knee 07/29/2021   Encounter for orthopedic follow-up care 03/22/2021   Closed displaced transverse fracture of left patella 03/19/2021   Pain in joint of right knee 03/07/2021   Pain of right shoulder joint on movement 11/02/2020   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   Acute appendicitis 01/10/2020   Hypercholesteremia 11/04/2018   Essential hypertension 11/04/2018   Hypercholesterolemia 11/04/2018   HTN (hypertension) 10/07/2018   Hypertensive disorder 10/07/2018    Past Surgical History:  Procedure Laterality Date   APPENDECTOMY     IRRIGATION AND DEBRIDEMENT KNEE Right 07/27/2020   Procedure: IRRIGATION AND DEBRIDEMENT KNEE;  Surgeon: Roby Lofts, MD;  Location: [facility] OR;  Service: Orthopedics;  Laterality: Right;   KNEE SURGERY     LAPAROSCOPIC APPENDECTOMY N/A 01/10/2020   Procedure: APPENDECTOMY LAPAROSCOPIC;  Surgeon: Abigail Miyamoto, MD;  Location: [facility];  Service:  General;  Laterality: N/A;   ORIF PATELLA Right 06/27/2020   Procedure: OPEN REDUCTION INTERNAL (ORIF) FIXATION PATELLA  NONUNION WITH ILIAC CREST MARROW ASPIRATION;  Surgeon: Roby Lofts, MD;  Location: [facility] OR;  Service: Orthopedics;  Laterality: Right;       Home Medications    Prior to Admission medications   Medication Sig Start Date End Date Taking? Authorizing Provider  albuterol (VENTOLIN HFA) 108 (90 Base) MCG/ACT inhaler Inhale 2 puffs into the lungs every 4 (four) hours as needed for wheezing or shortness of breath. 06/05/23  Yes Banister, Janace Aris, MD  amLODipine (NORVASC) 10 MG tablet Take 10 mg by mouth daily. 04/07/22  Yes [provider]  amoxicillin-clavulanate (AUGMENTIN) 875-125 MG tablet Take 1 tablet by mouth every 12 (twelve) hours. 06/05/23  Yes Coralyn Mark, NP  atorvastatin (LIPITOR) 80 MG tablet Take 80 mg by mouth daily. 01/04/20  Yes [provider]  co-enzyme Q-10 30 MG capsule Take 30 mg by mouth 3 (three) times daily. 11/04/18  Yes [provider]  dextromethorphan-guaiFENesin (TUSSIN DM) 10-100 MG/5ML liquid Take 10 mLs by mouth every 4 (four) hours as needed for cough. 06/05/23  Yes Coralyn Mark, NP  lisinopril-hydrochlorothiazide (ZESTORETIC) 20-25 MG tablet Take 1 tablet by mouth daily.   Yes [provider]  predniSONE (STERAPRED UNI-PAK 21 TAB) 10 MG (21) TBPK tablet Take by mouth daily. Take 6 tabs by mouth daily  for 2 days, then 5 tabs  for 2 days, then 4 tabs for 2 days, then 3 tabs for 2 days, 2 tabs for 2 days, then 1 tab by mouth daily for 2 days 06/05/23  Yes Coralyn Mark, NP  baclofen (LIORESAL) 20 MG tablet Take 1 tablet by mouth 3 (three) times daily as needed.    [provider]  benzonatate (TESSALON) 100 MG capsule Take 1 capsule (100 mg total) by mouth every 8 (eight) hours. 01/27/23   Tomi Bamberger, PA-C  cromolyn (OPTICROM) 4 % ophthalmic solution Place 1 drop into both eyes 4  (four) times daily. 12/11/22   [provider]  Cyanocobalamin POWD Take by mouth. 11/04/18   [provider]  fluorometholone (FML) 0.1 % ophthalmic suspension Place 1 drop into both eyes 4 (four) times daily. 11/18/22   [provider]  gabapentin (NEURONTIN) 100 MG capsule Take 100 mg by mouth as directed.    [provider]  HYDROcodone-acetaminophen (NORCO) 10-325 MG tablet Take 1 tablet by mouth every 6 (six) hours as needed for severe pain (pain score 7-10). 04/07/22   [provider]  HYDROcodone-acetaminophen (NORCO) 7.5-325 MG tablet Take 1 tablet by mouth every 6 (six) hours as needed for severe pain (pain score 7-10).    [provider]  ibuprofen (ADVIL) 800 MG tablet Take 800 mg by mouth 3 (three) times daily as needed. 11/07/22   [provider]  meloxicam (MOBIC) 15 MG tablet Take 15 mg by mouth daily.    [provider]  naloxone [facility]) nasal spray 4 mg/0.1 mL Place 1 spray into the nose once. 04/07/22   [provider]  naloxone [facility]) nasal spray 4 mg/0.1 mL Place 1 spray into the nose once.    [provider]  oxyCODONE-acetaminophen (PERCOCET) 10-325 MG tablet Take 1 tablet by mouth 4 (four) times daily as needed.    [provider]  promethazine-dextromethorphan (PROMETHAZINE-DM) 6.25-15 MG/5ML syrup Take 5 mLs by mouth at bedtime as needed for cough. 01/27/23   Tomi Bamberger, PA-C  TADALAFIL PO CHEW 1 TO 2 TABLETS BY MOUTH 30 TO 45 MINUTES BEFORE SEXUAL ACTIVITY AS NEEDED    [provider]  tobramycin-dexamethasone (TOBRADEX) ophthalmic solution Place 1 drop into both eyes every 4 (four) hours while awake. 11/07/22   [provider]  valACYclovir (VALTREX) 500 MG tablet Take 500 mg by mouth daily.    [provider]    Family History History reviewed. No pertinent family history.  Social History Social History   Tobacco Use   Smoking status:  Former    Types: Cigars   Smokeless tobacco: Never   Tobacco comments:    "every blue moon"  Vaping Use   Vaping status: Never Used  Substance Use Topics   Alcohol use: Yes    Comment: socially   Drug use: Not Currently     Allergies   Other   Review of Systems Review of Systems  Constitutional:  Positive for chills and fever.  HENT:  Positive for sinus pressure and sinus pain.   Respiratory:  Positive for cough and wheezing.   Cardiovascular: Negative.   Gastrointestinal: Negative.   Musculoskeletal:        Generalized body aches and weakness     Physical Exam Triage Vital Signs ED Triage Vitals  Encounter Vitals Group     BP 06/05/23 1243 119/76     Systolic BP Percentile --      Diastolic BP Percentile --  Pulse Rate 06/05/23 1243 71     Resp 06/05/23 1243 18     Temp 06/05/23 1243 98.4 F (36.9 C)     Temp Source 06/05/23 1243 Oral     SpO2 06/05/23 1243 98 %     Weight 06/05/23 1240 235 lb (106.6 kg)     Height 06/05/23 1240 6\' 1"  (1.854 m)     Head Circumference --      Peak Flow --      Pain Score 06/05/23 1237 0     Pain Loc --      Pain Education --      Exclude from Growth Chart --    No data found.  Updated Vital Signs BP 119/76 (BP Location: Left Arm)   Pulse 71   Temp 98.4 F (36.9 C) (Oral)   Resp 18   Ht 6\' 1"  (1.854 m)   Wt 235 lb (106.6 kg)   SpO2 98%   BMI 31.00 kg/m   Visual Acuity Right Eye Distance:   Left Eye Distance:   Bilateral Distance:    Right Eye Near:   Left Eye Near:    Bilateral Near:     Physical Exam Constitutional:      Appearance: He is ill-appearing.  HENT:     Right Ear: Tympanic membrane normal.     Left Ear: Tympanic membrane normal.     Nose: Nose normal.     Mouth/Throat:     Mouth: Mucous membranes are moist.  Eyes:     Pupils: Pupils are equal, round, and reactive to light.  Cardiovascular:     Rate and Rhythm: Normal rate.  Pulmonary:     Effort: Pulmonary effort is normal.      Breath sounds: Wheezing present.  Abdominal:     General: Abdomen is flat.  Musculoskeletal:        General: Normal range of motion.  Neurological:     General: No focal deficit present.     Mental Status: He is alert.      UC Treatments / Results  Labs (all labs ordered are listed, but only abnormal results are displayed) Labs Reviewed - No data to display  EKG   Radiology No results found.  Procedures Procedures (including critical care time)  Medications Ordered in UC Medications - No data to display  Initial Impression / Assessment and Plan / UC Course  I have reviewed the triage vital signs and the nursing notes.  Pertinent labs & imaging results that were available during my care of the patient were reviewed by me and considered in my medical decision making (see chart for details).     Can take over-the-counter cough and cold medicine Symptoms become worse you will need to be seen in the emergency room Take medications with food Tylenol or Motrin as needed for any food fever or body pain If you have increased shortness of breath or chest pain you will need to be seen in the emergency room Stay hydrated drink plenty of fluids Take cough medicine only as needed We are covering you with an antibiotic concerns of pneumonia   Waiting on your chest x-ray to return we will call you with any positive results   Final Clinical Impressions(s) / UC Diagnoses   Final diagnoses:  Acute cough  Wheezing  Generalized body aches  Shortness of breath     Discharge Instructions      Take medications with food Tylenol or Motrin as needed for  any food fever or body pain If you have increased shortness of breath or chest pain you will need to be seen in the emergency room Stay hydrated drink plenty of fluids Take cough medicine only as needed      ED Prescriptions     Medication Sig Dispense Auth. Provider   predniSONE (STERAPRED UNI-PAK 21 TAB) 10 MG (21)  TBPK tablet Take by mouth daily. Take 6 tabs by mouth daily  for 2 days, then 5 tabs for 2 days, then 4 tabs for 2 days, then 3 tabs for 2 days, 2 tabs for 2 days, then 1 tab by mouth daily for 2 days 42 tablet Maple Mirza L, NP   dextromethorphan-guaiFENesin (TUSSIN DM) 10-100 MG/5ML liquid Take 10 mLs by mouth every 4 (four) hours as needed for cough. 10 mL Coralyn Mark, NP   albuterol (VENTOLIN HFA) 108 (90 Base) MCG/ACT inhaler  (Status: Discontinued) Inhale 1 puff into the lungs every 4 (four) hours as needed for wheezing or shortness of breath. 18 g Coralyn Mark, NP   albuterol (VENTOLIN HFA) 108 (90 Base) MCG/ACT inhaler Inhale 2 puffs into the lungs every 4 (four) hours as needed for wheezing or shortness of breath. 1 each Zenia Resides, MD   amoxicillin-clavulanate (AUGMENTIN) 875-125 MG tablet Take 1 tablet by mouth every 12 (twelve) hours. 14 tablet Coralyn Mark, NP      PDMP not reviewed this encounter.   Coralyn Mark, NP 06/05/23 [phone]

## 2023-06-05 NOTE — Discharge Instructions (Addendum)
 Take medications with food Tylenol or Motrin as needed for any food fever or body pain If you have increased shortness of breath or chest pain you will need to be seen in the emergency room Stay hydrated drink plenty of fluids Take cough medicine only as needed

## 2023-10-29 ENCOUNTER — Ambulatory Visit: Admission: EM | Admit: 2023-10-29 | Discharge: 2023-10-29 | Disposition: A | Discharge status: Home / Self Care

## 2023-10-29 DIAGNOSIS — M545 Low back pain, unspecified: Secondary | ICD-10-CM

## 2023-10-29 MED ORDER — CYCLOBENZAPRINE HCL 10 MG PO TABS: 10.0000 mg | ORAL_TABLET | Freq: Two times a day (BID) | ORAL | 0 refills | Status: AC | PRN

## 2023-10-29 MED ORDER — PREDNISONE 20 MG PO TABS: 40.0000 mg | ORAL_TABLET | Freq: Every day | ORAL | 0 refills | Status: AC

## 2023-10-29 NOTE — ED Provider Notes (Signed)
 [name]    CSN: [id number] Arrival date & time: 10/29/23  9164      History   Chief Complaint Chief Complaint  Patient presents with   Back Pain    HPI Jason [name] is a 38 y.o. male.   Patient here today for evaluation of right sided low back pain that started about a week ago.  He denies any radiation of pain down his leg.  He has not had any injury that he is aware of.  He does state that movement worsens pain.  He denies any numbness or tingling.  The history is provided by the patient.  Back Pain Associated symptoms: no fever and no numbness     Past Medical History:  Diagnosis Date   Complication of anesthesia    broke out in hives from general anesthesia on 06/27/20   HTN (hypertension) 10/07/2018   Hypertension     Patient Active Problem List   Diagnosis Date Noted   Pain in joint of right elbow 10/30/2021   Closed fracture of head of right radius 10/30/2021   Other injury of muscle, fascia and tendon of other parts of biceps, right arm, subsequent encounter 10/30/2021   Pain in right elbow 10/30/2021   Stiffness of right knee 10/16/2021   Tendonitis of knee, right 09/05/2021   Patellar tendinitis, right knee 09/05/2021   History of arthroscopy of knee 07/29/2021   Other specified postprocedural states 07/29/2021   Encounter for orthopedic follow-up care 03/22/2021   Encounter for other orthopedic aftercare 03/22/2021   Closed displaced transverse fracture of left patella 03/19/2021   Displaced transverse fracture of right patella, subsequent encounter for closed fracture with nonunion 03/19/2021   Pain in joint of right knee 03/07/2021   Pain in right knee 03/07/2021   Pain of right shoulder joint on movement 11/02/2020   Pain in right shoulder 11/02/2020   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   Acute appendicitis 01/10/2020   Hypercholesteremia 11/04/2018   Essential hypertension 11/04/2018    Hypercholesterolemia 11/04/2018   HTN (hypertension) 10/07/2018   Hypertensive disorder 10/07/2018    Past Surgical History:  Procedure Laterality Date   APPENDECTOMY     IRRIGATION AND DEBRIDEMENT KNEE Right 07/27/2020   Procedure: IRRIGATION AND DEBRIDEMENT KNEE;  Surgeon: Kendal Franky SQUIBB, MD;  Location: [facility];  Service: Orthopedics;  Laterality: Right;   KNEE SURGERY     LAPAROSCOPIC APPENDECTOMY N/A 01/10/2020   Procedure: APPENDECTOMY LAPAROSCOPIC;  Surgeon: Vernetta Berg, MD;  Location: [facility];  Service: General;  Laterality: N/A;   ORIF PATELLA Right 06/27/2020   Procedure: OPEN REDUCTION INTERNAL (ORIF) FIXATION PATELLA  NONUNION WITH ILIAC CREST MARROW ASPIRATION;  Surgeon: Kendal Franky SQUIBB, MD;  Location: [facility];  Service: Orthopedics;  Laterality: Right;       Home Medications    Prior to Admission medications   Medication Sig Start Date End Date Taking? Authorizing Provider  atorvastatin  (LIPITOR) 80 MG tablet Take 1 tablet by mouth daily. 04/07/22  Yes [provider]  cyclobenzaprine  (FLEXERIL ) 10 MG tablet Take 1 tablet (10 mg total) by mouth 2 (two) times daily as needed for muscle spasms. 10/29/23  Yes Billy Asberry FALCON, PA-C  lidocaine  (LIDODERM ) 5 % Place 1 patch onto the skin daily. Remove & Discard patch within 12 hours or as directed by MD   Yes [provider]  predniSONE  (DELTASONE ) 20 MG tablet Take 2 tablets (40 mg total) by mouth daily with  breakfast for 5 days. 10/29/23 11/03/23 Yes Billy Asberry FALCON, PA-C  valACYclovir (VALTREX) 500 MG tablet Take 1 tablet by mouth daily. 04/07/22  Yes [provider]  albuterol  (VENTOLIN  HFA) 108 (90 Base) MCG/ACT inhaler Inhale 2 puffs into the lungs every 4 (four) hours as needed for wheezing or shortness of breath. 06/05/23   Vonna Sharlet POUR, MD  amLODipine  (NORVASC ) 10 MG tablet Take 10 mg by mouth daily. 04/07/22   [provider]  amoxicillin -clavulanate (AUGMENTIN ) 875-125 MG tablet Take  1 tablet by mouth every 12 (twelve) hours. 06/05/23   Merilee Andrea CROME, NP  atorvastatin  (LIPITOR) 20 MG tablet Take 1 tablet by mouth at bedtime.    [provider]  atorvastatin  (LIPITOR) 80 MG tablet Take 80 mg by mouth daily. 01/04/20   [provider]  co-enzyme Q-10 30 MG capsule Take 30 mg by mouth 3 (three) times daily. 11/04/18   [provider]  cromolyn (OPTICROM) 4 % ophthalmic solution Place 1 drop into both eyes 4 (four) times daily. 12/11/22   [provider]  Cyanocobalamin  POWD Take by mouth. 11/04/18   [provider]  fluorometholone (FML) 0.1 % ophthalmic suspension Place 1 drop into both eyes 4 (four) times daily. 11/18/22   [provider]  gabapentin  (NEURONTIN ) 100 MG capsule Take 100 mg by mouth as directed.    [provider]  HYDROcodone -acetaminophen  (NORCO) 10-325 MG tablet Take 1 tablet by mouth every 6 (six) hours as needed for severe pain (pain score 7-10). 04/07/22   [provider]  HYDROcodone -acetaminophen  (NORCO) 7.5-325 MG tablet Take 1 tablet by mouth every 6 (six) hours as needed for severe pain (pain score 7-10).    [provider]  ibuprofen (ADVIL) 800 MG tablet Take 800 mg by mouth 3 (three) times daily as needed. 11/07/22   [provider]  lisinopril-hydrochlorothiazide (ZESTORETIC) 20-12.5 MG tablet Take 1 tablet by mouth daily.    [provider]  lisinopril-hydrochlorothiazide (ZESTORETIC) 20-25 MG tablet Take 1 tablet by mouth daily.    [provider]  meloxicam (MOBIC) 15 MG tablet Take 15 mg by mouth daily.    [provider]  naloxone [facility]) nasal spray 4 mg/0.1 mL Place 1 spray into the nose once. 04/07/22   [provider]  naloxone [facility]) nasal spray 4 mg/0.1 mL Place 1 spray into the nose once.    [provider]  oxyCODONE -acetaminophen  (PERCOCET) 10-325 MG tablet Take 1 tablet by mouth 4 (four) times daily as  needed.    [provider]  promethazine -dextromethorphan  (PROMETHAZINE -DM) 6.25-15 MG/5ML syrup Take 5 mLs by mouth 4 (four) times daily as needed for cough. 06/05/23   Banister, Pamela K, MD  TADALAFIL PO CHEW 1 TO 2 TABLETS BY MOUTH 30 TO 45 MINUTES BEFORE SEXUAL ACTIVITY AS NEEDED    [provider]  tobramycin -dexamethasone  (TOBRADEX) ophthalmic solution Place 1 drop into both eyes every 4 (four) hours while awake. 11/07/22   [provider]  valACYclovir (VALTREX) 500 MG tablet Take 500 mg by mouth daily.    [provider]    Family History History reviewed. No pertinent family history.  Social History Social History   Tobacco Use   Smoking status: Former    Types: Cigars   Smokeless tobacco: Never   Tobacco comments:    every blue moon  Vaping Use   Vaping status: Never Used  Substance Use Topics   Alcohol use: Yes    Comment: socially  Drug use: Not Currently     Allergies   Other   Review of Systems Review of Systems  Constitutional:  Negative for chills and fever.  Eyes:  Negative for discharge and redness.  Respiratory:  Negative for shortness of breath.   Musculoskeletal:  Positive for back pain and myalgias.  Neurological:  Negative for numbness.     Physical Exam Triage Vital Signs ED Triage Vitals  Encounter Vitals Group     BP 10/29/23 0859 124/83     Girls Systolic BP Percentile --      Girls Diastolic BP Percentile --      Boys Systolic BP Percentile --      Boys Diastolic BP Percentile --      Pulse Rate 10/29/23 0859 77     Resp 10/29/23 0859 18     Temp 10/29/23 0859 98.4 F (36.9 C)     Temp Source 10/29/23 0859 Oral     SpO2 10/29/23 0859 97 %     Weight 10/29/23 0857 235 lb (106.6 kg)     Height 10/29/23 0857 6' 1 (1.854 m)     Head Circumference --      Peak Flow --      Pain Score 10/29/23 0854 8     Pain Loc --      Pain Education --      Exclude from Growth Chart --    No data  found.  Updated Vital Signs BP 124/83 (BP Location: Left Arm)   Pulse 77   Temp 98.4 F (36.9 C) (Oral)   Resp 18   Ht 6' 1 (1.854 m)   Wt 235 lb (106.6 kg)   SpO2 97%   BMI 31.00 kg/m   Visual Acuity Right Eye Distance:   Left Eye Distance:   Bilateral Distance:    Right Eye Near:   Left Eye Near:    Bilateral Near:     Physical Exam Vitals and nursing note reviewed.  Constitutional:      General: He is not in acute distress.    Appearance: Normal appearance. He is not ill-appearing.  HENT:     Head: Normocephalic and atraumatic.  Eyes:     Conjunctiva/sclera: Conjunctivae normal.  Cardiovascular:     Rate and Rhythm: Normal rate.  Pulmonary:     Effort: Pulmonary effort is normal. No respiratory distress.  Musculoskeletal:     Comments: No tenderness to palpation noted to midline spine other than mildly to paraspinal musculature to right low back.  Tenderness also noted to right low back laterally.  Neurological:     Mental Status: He is alert.  Psychiatric:        Mood and Affect: Mood normal.        Behavior: Behavior normal.        Thought Content: Thought content normal.      UC Treatments / Results  Labs (all labs ordered are listed, but only abnormal results are displayed) Labs Reviewed - No data to display  EKG   Radiology No results found.  Procedures Procedures (including critical care time)  Medications Ordered in UC Medications - No data to display  Initial Impression / Assessment and Plan / UC Course  I have reviewed the triage vital signs and the nursing notes.  Pertinent labs & imaging results that were available during my care of the patient were reviewed by me and considered in my medical decision making (see chart for details).  Steroid burst and muscle relaxer prescribed for suspected muscle strain.  Advised muscle relaxer may cause drowsiness and use with caution.  Encouraged follow-up if no gradual improvement with any  further concerns.  Final Clinical Impressions(s) / UC Diagnoses   Final diagnoses:  Acute right-sided low back pain without sciatica   Discharge Instructions   None    ED Prescriptions     Medication Sig Dispense Auth. Provider   predniSONE  (DELTASONE ) 20 MG tablet Take 2 tablets (40 mg total) by mouth daily with breakfast for 5 days. 10 tablet Billy Asberry FALCON, PA-C   cyclobenzaprine  (FLEXERIL ) 10 MG tablet Take 1 tablet (10 mg total) by mouth 2 (two) times daily as needed for muscle spasms. 20 tablet Billy Asberry FALCON, PA-C      PDMP not reviewed this encounter.   Billy Asberry FALCON, PA-C 10/29/23 1407

## 2023-10-29 NOTE — ED Triage Notes (Signed)
 My back is killing me starting last week with sharp pain (mid lower with radiation of pain to right side (sciatic area). 2 days ago when driving I even caught a cramp of this pain in my stomach with certain movements. No obvious injury. I will need a work note too.
